# Patient Record
Sex: Female | Born: 1940 | Race: White | Hispanic: No | Smoking: Former smoker
Health system: Southern US, Community
[De-identification: ages and names within clinical notes are randomized; demographics above are authoritative.]

## PROBLEM LIST (undated history)

## (undated) DIAGNOSIS — I10 Essential (primary) hypertension: Secondary | ICD-10-CM

## (undated) DIAGNOSIS — M1990 Unspecified osteoarthritis, unspecified site: Secondary | ICD-10-CM

## (undated) DIAGNOSIS — I2510 Atherosclerotic heart disease of native coronary artery without angina pectoris: Secondary | ICD-10-CM

## (undated) DIAGNOSIS — I219 Acute myocardial infarction, unspecified: Secondary | ICD-10-CM

## (undated) HISTORY — PX: CORONARY STENT PLACEMENT: SHX1402

---

## 1998-01-23 ENCOUNTER — Ambulatory Visit (HOSPITAL_BASED_OUTPATIENT_CLINIC_OR_DEPARTMENT_OTHER): Admission: RE | Admit: 1998-01-23 | Discharge: 1998-01-23 | Payer: Self-pay | Admitting: Orthopedic Surgery

## 1998-09-18 ENCOUNTER — Ambulatory Visit (HOSPITAL_BASED_OUTPATIENT_CLINIC_OR_DEPARTMENT_OTHER): Admission: RE | Admit: 1998-09-18 | Discharge: 1998-09-18 | Payer: Self-pay | Admitting: Orthopedic Surgery

## 2000-02-21 ENCOUNTER — Other Ambulatory Visit: Admission: RE | Admit: 2000-02-21 | Discharge: 2000-02-21 | Payer: Self-pay | Admitting: Family Medicine

## 2005-03-17 ENCOUNTER — Emergency Department (HOSPITAL_COMMUNITY): Admission: EM | Admit: 2005-03-17 | Discharge: 2005-03-17 | Payer: Self-pay | Admitting: *Deleted

## 2006-02-20 ENCOUNTER — Emergency Department (HOSPITAL_COMMUNITY): Admission: EM | Admit: 2006-02-20 | Discharge: 2006-02-21 | Payer: Self-pay | Admitting: Emergency Medicine

## 2007-05-15 ENCOUNTER — Emergency Department (HOSPITAL_COMMUNITY): Admission: EM | Admit: 2007-05-15 | Discharge: 2007-05-15 | Payer: Self-pay | Admitting: Emergency Medicine

## 2010-05-30 ENCOUNTER — Ambulatory Visit (HOSPITAL_COMMUNITY)
Admission: RE | Admit: 2010-05-30 | Discharge: 2010-05-30 | Payer: Self-pay | Source: Home / Self Care | Attending: Family Medicine | Admitting: Family Medicine

## 2010-05-30 ENCOUNTER — Encounter
Admission: RE | Admit: 2010-05-30 | Discharge: 2010-05-30 | Payer: Self-pay | Source: Home / Self Care | Attending: Family Medicine | Admitting: Family Medicine

## 2010-08-01 ENCOUNTER — Ambulatory Visit (HOSPITAL_COMMUNITY): Payer: Medicare Other

## 2010-08-01 ENCOUNTER — Observation Stay (HOSPITAL_COMMUNITY)
Admission: RE | Admit: 2010-08-01 | Discharge: 2010-08-02 | Disposition: A | Discharge status: Home / Self Care | Payer: Medicare Other | Source: Ambulatory Visit | Attending: Ophthalmology | Admitting: Ophthalmology

## 2010-08-01 ENCOUNTER — Other Ambulatory Visit: Payer: Self-pay | Admitting: Ophthalmology

## 2010-08-01 DIAGNOSIS — J45909 Unspecified asthma, uncomplicated: Secondary | ICD-10-CM | POA: Insufficient documentation

## 2010-08-01 DIAGNOSIS — I10 Essential (primary) hypertension: Secondary | ICD-10-CM | POA: Insufficient documentation

## 2010-08-01 DIAGNOSIS — H4310 Vitreous hemorrhage, unspecified eye: Secondary | ICD-10-CM | POA: Insufficient documentation

## 2010-08-01 DIAGNOSIS — Y849 Medical procedure, unspecified as the cause of abnormal reaction of the patient, or of later complication, without mention of misadventure at the time of the procedure: Secondary | ICD-10-CM | POA: Insufficient documentation

## 2010-08-01 DIAGNOSIS — T8529XA Other mechanical complication of intraocular lens, initial encounter: Principal | ICD-10-CM | POA: Insufficient documentation

## 2010-08-01 LAB — CBC
HCT: 42.1 % (ref 36.0–46.0)
Hemoglobin: 14.7 g/dL (ref 12.0–15.0)
MCH: 32.3 pg (ref 26.0–34.0)
MCHC: 34.9 g/dL (ref 30.0–36.0)
Platelets: 219 10*3/uL (ref 150–400)
RBC: 4.55 MIL/uL (ref 3.87–5.11)
RDW: 12.9 % (ref 11.5–15.5)
WBC: 8.1 10*3/uL (ref 4.0–10.5)

## 2010-08-01 LAB — SURGICAL PCR SCREEN
MRSA, PCR: NEGATIVE
Staphylococcus aureus: NEGATIVE

## 2010-08-01 LAB — BASIC METABOLIC PANEL
Calcium: 9.8 mg/dL (ref 8.4–10.5)
Chloride: 103 mEq/L (ref 96–112)
Creatinine, Ser: 1.02 mg/dL (ref 0.4–1.2)
GFR calc Af Amer: >60 mL/min (ref >60)
Glucose, Bld: 94 mg/dL (ref 70–99)
Potassium: 4.8 mEq/L (ref 3.5–5.1)
Sodium: 142 mEq/L (ref 135–145)

## 2010-08-09 NOTE — Op Note (Signed)
Brittany Arnold, NOKES             ACCOUNT NO.:  [id number]  MEDICAL RECORD NO.:  [id number]           PATIENT TYPE:  O  LOCATION:  5148                         FACILITY:  [facility]  PHYSICIAN:  Beulah Gandy. Ashley Royalty, M.D. DATE OF BIRTH:  Nov 17, 1940  DATE OF PROCEDURE:  08/01/2010 DATE OF DISCHARGE:                              OPERATIVE REPORT   ADMISSION DIAGNOSIS:  Dislocated intraocular lens, vitreous hemorrhage, retained lens material, left eye.  PROCEDURES:  Pars plana vitrectomy, retinal photocoagulation, removal of intraocular lens from vitreous, placement of secondary intraocular lens, gas-fluid exchange, left eye.  SURGEON:  Beulah Gandy. Ashley Royalty, MD  ASSISTANT:  Rosalie Doctor, MA  ANESTHESIA:  General.  DETAILS:  Usual prep and drape, conjunctival peritomy from 8 o'clock around to 4 o'clock.  Half-thickness scleral flaps were raised at 3 and 9 o'clock in anticipation of IOL suture.  A three-layered corneoscleral wound was started back in the white space from 10 o'clock to 2 o'clock. A 25-gauge trocar was placed at 10, 2, and 4 o'clock.  Pars plana vitrectomy was begun just behind the pupillary axis.  Some capsular remnants were seen and in the vitreous was ensnaring the intraocular lens.  The vitreous carefully teased from the lens with the vitrectomy cutter and the lighted pick.  The lens was eventually freed and passed from the posterior chamber into the anterior chamber and out through the corneoscleral wound.  The vitrectomy was continued out into the far peripheral vitreous area down to the vitreous base where the vitreous base was trimmed.  All lens material was removed and vitreous hemorrhage was removed.  The indirect ophthalmoscope laser was moved into place, 680 burns were placed around the retinal periphery with a power of 280 mW, 1000 microns each, and 0.1 seconds each.  These burns were placed around weak areas in the retina and areas suspicious for break.   Two Prolene sutures were passed from 9 o'clock to 3 o'clock behind the iris and anterior to the capsular remnants.  The sutures were externalized through the corneal wound.  A new intraocular lens was brought onto the field made by [employer] model CZ70BD, power 16.5D, length 12.5 mm, optic 7.0 mm, serial number [id number], expiration date December 2013.  This lens was brought onto the field, inspected and cleaned.  The eyelets of the lens were attached to the Prolene sutures. The lens was then passed into the posterior chamber and dialed into place in the sulcus.  The Prolene sutures were knotted externally beneath the scleral flaps and scleral flaps covered the knots.  The cornea was closed with five interrupted 10-0 nylon sutures.  Additional vitrectomy was performed at this time removing pigment granules from the vitreous and performing a 30% gas-fluid exchange.  The instruments were removed and the trocars were removed from the eye.  The wounds were tested and found to be tight.  The conjunctiva was closed with 7-0chromic suture.  Polymyxin and gentamicin were irrigated into [place] space.  Closing pressure was 10 with a Risk manager. Decadron 10 mg was injected into the lower subconjunctival space. Marcaine was injected around the globe for  postop pain.  Polysporin ophthalmic ointment and a patch and shield were placed.  The patient was awakened and taken to recovery in satisfactory condition.  Complications none.  Duration 1 hour and 30 minutes.     Beulah Gandy. Ashley Royalty, M.D.     JDM/MEDQ  D:  08/01/2010  T:  08/02/2010  Job:  [id number]  Electronically Signed by Alan Mulder M.D. on 08/09/2010 06:13:50 AM

## 2011-01-24 ENCOUNTER — Ambulatory Visit
Admission: RE | Admit: 2011-01-24 | Discharge: 2011-01-24 | Disposition: A | Discharge status: Home / Self Care | Payer: Medicare Other | Source: Ambulatory Visit | Attending: Family Medicine | Admitting: Family Medicine

## 2011-01-24 ENCOUNTER — Other Ambulatory Visit: Payer: Self-pay | Admitting: Family Medicine

## 2011-01-24 DIAGNOSIS — R52 Pain, unspecified: Secondary | ICD-10-CM

## 2011-01-30 LAB — COMPREHENSIVE METABOLIC PANEL
ALT: 10
AST: 16
Albumin: 3.7
Alkaline Phosphatase: 58
BUN: 20
CO2: 28
Calcium: 9
Chloride: 96
Creatinine, Ser: 1.18
GFR calc Af Amer: 56 — ABNORMAL LOW
GFR calc non Af Amer: 46 — ABNORMAL LOW
Glucose, Bld: 113 — ABNORMAL HIGH
Potassium: 2.7 — CL
Sodium: 135
Total Bilirubin: 0.6
Total Protein: 6.7

## 2011-01-30 LAB — DIFFERENTIAL
Basophils Absolute: 0.1
Basophils Relative: 1
Eosinophils Absolute: 0.2
Eosinophils Relative: 2
Lymphocytes Relative: 19
Lymphs Abs: 2
Monocytes Absolute: 0.6
Monocytes Relative: 6
Neutro Abs: 8 — ABNORMAL HIGH
Neutrophils Relative %: 73

## 2011-01-30 LAB — CBC
HCT: 36.8
Hemoglobin: 13
MCHC: 35.2
MCV: 91.3
Platelets: 297
RBC: 4.03
RDW: 13.2
WBC: 10.9 — ABNORMAL HIGH

## 2011-01-30 LAB — POCT CARDIAC MARKERS
CKMB, poc: <1 — ABNORMAL LOW
Myoglobin, poc: 82.6
Operator id: 3206
Troponin i, poc: <0.05

## 2011-03-12 ENCOUNTER — Inpatient Hospital Stay (HOSPITAL_COMMUNITY)
Admission: EM | Admit: 2011-03-12 | Discharge: 2011-03-16 | DRG: 247 | Disposition: A | Discharge status: Home / Self Care | Payer: Medicare Other | Source: Other Acute Inpatient Hospital | Attending: Interventional Cardiology | Admitting: Interventional Cardiology

## 2011-03-12 DIAGNOSIS — I2510 Atherosclerotic heart disease of native coronary artery without angina pectoris: Secondary | ICD-10-CM | POA: Diagnosis not present

## 2011-03-12 DIAGNOSIS — I2109 ST elevation (STEMI) myocardial infarction involving other coronary artery of anterior wall: Secondary | ICD-10-CM | POA: Diagnosis present

## 2011-03-12 DIAGNOSIS — J449 Chronic obstructive pulmonary disease, unspecified: Secondary | ICD-10-CM | POA: Diagnosis present

## 2011-03-12 DIAGNOSIS — F17200 Nicotine dependence, unspecified, uncomplicated: Secondary | ICD-10-CM | POA: Diagnosis present

## 2011-03-12 DIAGNOSIS — I214 Non-ST elevation (NSTEMI) myocardial infarction: Secondary | ICD-10-CM

## 2011-03-12 DIAGNOSIS — I219 Acute myocardial infarction, unspecified: Secondary | ICD-10-CM

## 2011-03-12 DIAGNOSIS — E785 Hyperlipidemia, unspecified: Secondary | ICD-10-CM | POA: Diagnosis present

## 2011-03-12 DIAGNOSIS — I10 Essential (primary) hypertension: Secondary | ICD-10-CM | POA: Diagnosis present

## 2011-03-12 DIAGNOSIS — J4489 Other specified chronic obstructive pulmonary disease: Secondary | ICD-10-CM | POA: Diagnosis present

## 2011-03-12 DIAGNOSIS — R918 Other nonspecific abnormal finding of lung field: Secondary | ICD-10-CM | POA: Diagnosis not present

## 2011-03-12 LAB — CARDIAC PANEL(CRET KIN+CKTOT+MB+TROPI)
CK, MB: 2.5 ng/mL (ref 0.3–4.0)
CK, MB: 29.3 ng/mL (ref 0.3–4.0)
CK, MB: 40.2 ng/mL (ref 0.3–4.0)
Relative Index: INVALID (ref 0.0–2.5)
Relative Index: 10.7 — ABNORMAL HIGH (ref 0.0–2.5)
Relative Index: 9.5 — ABNORMAL HIGH (ref 0.0–2.5)
Total CK: 55 U/L (ref 7–177)
Total CK: 273 U/L — ABNORMAL HIGH (ref 7–177)
Total CK: 423 U/L — ABNORMAL HIGH (ref 7–177)
Troponin I: 0.32 ng/mL (ref <0.30)
Troponin I: 8.23 ng/mL (ref <0.30)
Troponin I: 6.1 ng/mL (ref <0.30)

## 2011-03-12 LAB — CBC
Hemoglobin: 13.9 g/dL (ref 12.0–15.0)
MCH: 32.7 pg (ref 26.0–34.0)
MCHC: 34.6 g/dL (ref 30.0–36.0)
Platelets: 215 10*3/uL (ref 150–400)
RBC: 4.25 MIL/uL (ref 3.87–5.11)
RDW: 13 % (ref 11.5–15.5)
WBC: 11.4 10*3/uL — ABNORMAL HIGH (ref 4.0–10.5)

## 2011-03-12 LAB — POCT I-STAT, CHEM 8
BUN: 18 mg/dL (ref 6–23)
Calcium, Ion: 1.16 mmol/L (ref 1.12–1.32)
Chloride: 103 mEq/L (ref 96–112)
Creatinine, Ser: 1.3 mg/dL — ABNORMAL HIGH (ref 0.50–1.10)
Glucose, Bld: 139 mg/dL — ABNORMAL HIGH (ref 70–99)
HCT: 44 % (ref 36.0–46.0)
Hemoglobin: 15 g/dL (ref 12.0–15.0)
Potassium: 2.7 mEq/L — CL (ref 3.5–5.1)
Sodium: 141 mEq/L (ref 135–145)
TCO2: 26 mmol/L (ref 0–100)

## 2011-03-12 LAB — PRO B NATRIURETIC PEPTIDE: Pro B Natriuretic peptide (BNP): 132.8 pg/mL — ABNORMAL HIGH (ref 0–125)

## 2011-03-12 LAB — BASIC METABOLIC PANEL
BUN: 17 mg/dL (ref 6–23)
Calcium: 9.9 mg/dL (ref 8.4–10.5)
Chloride: 99 mEq/L (ref 96–112)
Creatinine, Ser: 1.1 mg/dL (ref 0.50–1.10)
GFR calc non Af Amer: 50 mL/min — ABNORMAL LOW (ref >90)
Glucose, Bld: 140 mg/dL — ABNORMAL HIGH (ref 70–99)
Potassium: 2.6 mEq/L — CL (ref 3.5–5.1)

## 2011-03-12 LAB — DIFFERENTIAL
Basophils Absolute: 0 10*3/uL (ref 0.0–0.1)
Basophils Relative: 0 % (ref 0–1)
Eosinophils Absolute: 0.3 10*3/uL (ref 0.0–0.7)
Eosinophils Relative: 3 % (ref 0–5)
Lymphocytes Relative: 26 % (ref 12–46)
Lymphs Abs: 2.9 10*3/uL (ref 0.7–4.0)
Monocytes Absolute: 0.8 10*3/uL (ref 0.1–1.0)
Monocytes Relative: 7 % (ref 3–12)
Neutro Abs: 7.3 10*3/uL (ref 1.7–7.7)
Neutrophils Relative %: 64 % (ref 43–77)

## 2011-03-12 LAB — POCT I-STAT TROPONIN I: Troponin i, poc: 0.1 ng/mL (ref 0.00–0.08)

## 2011-03-12 LAB — POCT ACTIVATED CLOTTING TIME: Activated Clotting Time: 721 seconds

## 2011-03-12 LAB — MRSA PCR SCREENING: MRSA by PCR: NEGATIVE

## 2011-03-12 LAB — PROTIME-INR: Prothrombin Time: 14.6 seconds (ref 11.6–15.2)

## 2011-03-13 LAB — BASIC METABOLIC PANEL
BUN: 14 mg/dL (ref 6–23)
CO2: 27 mEq/L (ref 19–32)
Calcium: 9.7 mg/dL (ref 8.4–10.5)
Chloride: 100 mEq/L (ref 96–112)
Creatinine, Ser: 1.01 mg/dL (ref 0.50–1.10)
GFR calc Af Amer: 64 mL/min — ABNORMAL LOW (ref >90)
GFR calc non Af Amer: 55 mL/min — ABNORMAL LOW (ref >90)
Glucose, Bld: 107 mg/dL — ABNORMAL HIGH (ref 70–99)
Potassium: 3.7 mEq/L (ref 3.5–5.1)
Sodium: 140 mEq/L (ref 135–145)

## 2011-03-13 LAB — CBC
HCT: 42.8 % (ref 36.0–46.0)
Hemoglobin: 14.1 g/dL (ref 12.0–15.0)
MCH: 31.1 pg (ref 26.0–34.0)
MCHC: 32.9 g/dL (ref 30.0–36.0)
MCV: 94.3 fL (ref 78.0–100.0)
Platelets: 236 10*3/uL (ref 150–400)
RBC: 4.54 MIL/uL (ref 3.87–5.11)
RDW: 12.8 % (ref 11.5–15.5)
WBC: 11.6 10*3/uL — ABNORMAL HIGH (ref 4.0–10.5)

## 2011-03-13 NOTE — H&P (Signed)
NAMECHAU, SAVELL NO.:  [id number]  MEDICAL RECORD NO.:  [id number]  LOCATION:  2902                         FACILITY:  [facility]  PHYSICIAN:  Harlon Flor, MD   DATE OF BIRTH:  Jun 25, 1940  DATE OF ADMISSION:  03/12/2011 DATE OF DISCHARGE:                             HISTORY & PHYSICAL   ADMISSION DIAGNOSIS:  ST-elevation myocardial infarction.  CHIEF COMPLAINT:  Chest pain.  HISTORY OF PRESENT ILLNESS:  Ms. Brittany Arnold is a 70 year old, white female with past medical history significant for hypertension who presented to the emergency room tonight via ambulance with chest pain.  She was awoken by pressure-like chest pain and called 911.  The cath lab was activated by EMS en route due to anterior ST elevation.  En route, she went into ventricular tachycardia requiring cardioversion.  On arrival, she was somewhat somnolent but responsive to pain.  Her pain had resolved, and her STs had come down.  Her previous EKG prior to the VT showed hyperacute T-waves in anterior leads.  Due to the patient's somnolence, further history was difficult.  She denied any history of bleeding or upcoming surgeries.  She did recently have a surgery in March for a dislocated intra-ocular lens.  PAST MEDICAL HISTORY: 1. Hypertension. 2. COPD. 3. Dislocated intra-ocular lens and vitreous hemorrhage status post     surgery per Dr. Ashley Arnold in March 2012.  HOME MEDICATIONS:  Please refer to the med reconciliation.  The patient could only state she was on a blood pressure pill and a fluid pill as well as a muscle relaxer prior to her heart cath.  ALLERGIES:  PENICILLIN and SULFA.  SOCIAL HISTORY:  She lives at home, and she does smoke cigarettes and drinks alcohol only occasionally.  FAMILY HISTORY:  She was unaware of any family history of heart disease, although this history was somewhat limited.  REVIEW OF SYSTEMS:  Unable to obtain due to her somnolence at the time of  evaluation prior to her heart cath.  PHYSICAL EXAMINATION:  VITAL SIGNS:  Blood pressure 125/80, heart rate 75, respirations 16, temperature afebrile. GENERAL:  Somnolent but in no acute distress. HEENT:  Extraocular movements intact.  Oropharynx benign.  Nonicteric sclerae. NECK:  Supple. CARDIOVASCULAR:  Regular rate and rhythm without murmurs, rubs, or gallops. LUNGS:  Clear to auscultation bilaterally. ABDOMEN:  Soft, nontender, nondistended. EXTREMITIES:  Without edema.  Pulses were intact femoral and dorsalis pedis bilaterally. NEURO:  Grossly afocal.  She moves all extremities well.  Had equal strength in bilateral arms and legs, and answered questions appropriately although she was somewhat somnolent. SKIN:  No rashes. LYMPH:  No lymphadenopathy.  DIAGNOSTIC STUDIES:  EKG from EMS shows normal sinus rhythm with hyperacute T-waves in the anterior precordial leads.  Chest x-ray is pending.  White count of 11.4, hemoglobin 13.9, platelets 215, point of care troponin is elevated at 0.10.  Her BUN is 18 and creatinine is 1.3. Further labs are pending.  ASSESSMENT AND PLAN:  Brittany Arnold is a 70 year old white female with tobacco abuse and hypertension who presents to the ER with an anterior STEMI and went directly to the cath lab receiving a drug-eluting Resolute stent to her  mid LAD per Dr. Katrinka Arnold.  We will continue aspirin and Plavix, start her on metoprolol as tolerated, check her lipids and start statin along with routine post STEMI care.     Harlon Flor, MD     MMB/MEDQ  D:  03/12/2011  T:  03/12/2011  Job:  [id number]  Electronically Signed by Meridee Score MD on 03/13/2011 10:06:16 PM

## 2011-03-14 ENCOUNTER — Inpatient Hospital Stay (HOSPITAL_COMMUNITY): Payer: Medicare Other

## 2011-03-14 LAB — BASIC METABOLIC PANEL
BUN: 21 mg/dL (ref 6–23)
CO2: 28 mEq/L (ref 19–32)
Chloride: 98 mEq/L (ref 96–112)
Creatinine, Ser: 1.14 mg/dL — ABNORMAL HIGH (ref 0.50–1.10)
Glucose, Bld: 103 mg/dL — ABNORMAL HIGH (ref 70–99)

## 2011-03-14 LAB — CK TOTAL AND CKMB (NOT AT ARMC): Relative Index: 3.6 — ABNORMAL HIGH (ref 0.0–2.5)

## 2011-03-14 IMAGING — CR DG CHEST 2V
2 series · 2 of 2 positions shown · non-contrast
Comparison: [DATE]

CLINICAL DATA: Weakness.  Shortness of breath.  Heart stent placed.
Ex-smoker.

CHEST - 2 VIEW

[w chest pa]
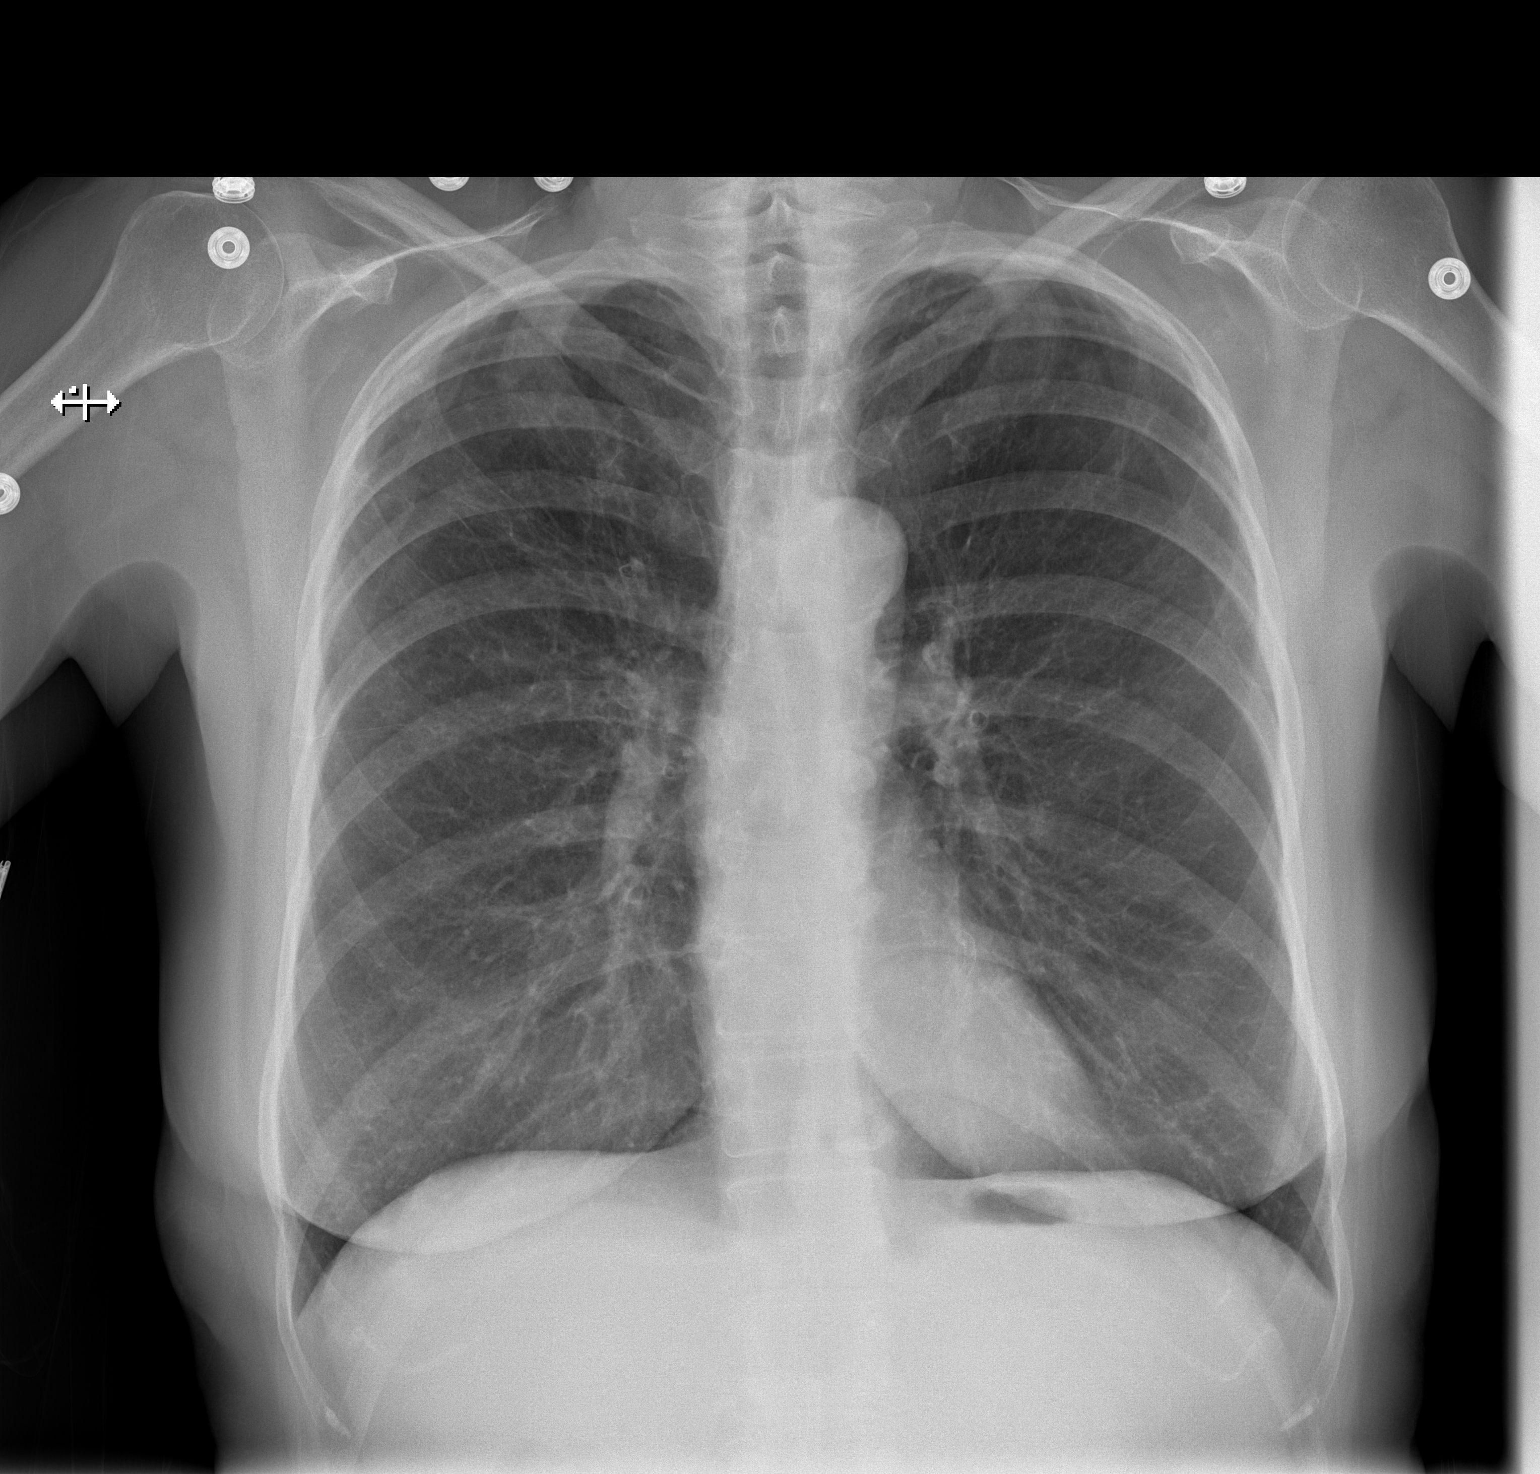

[w chest lat]
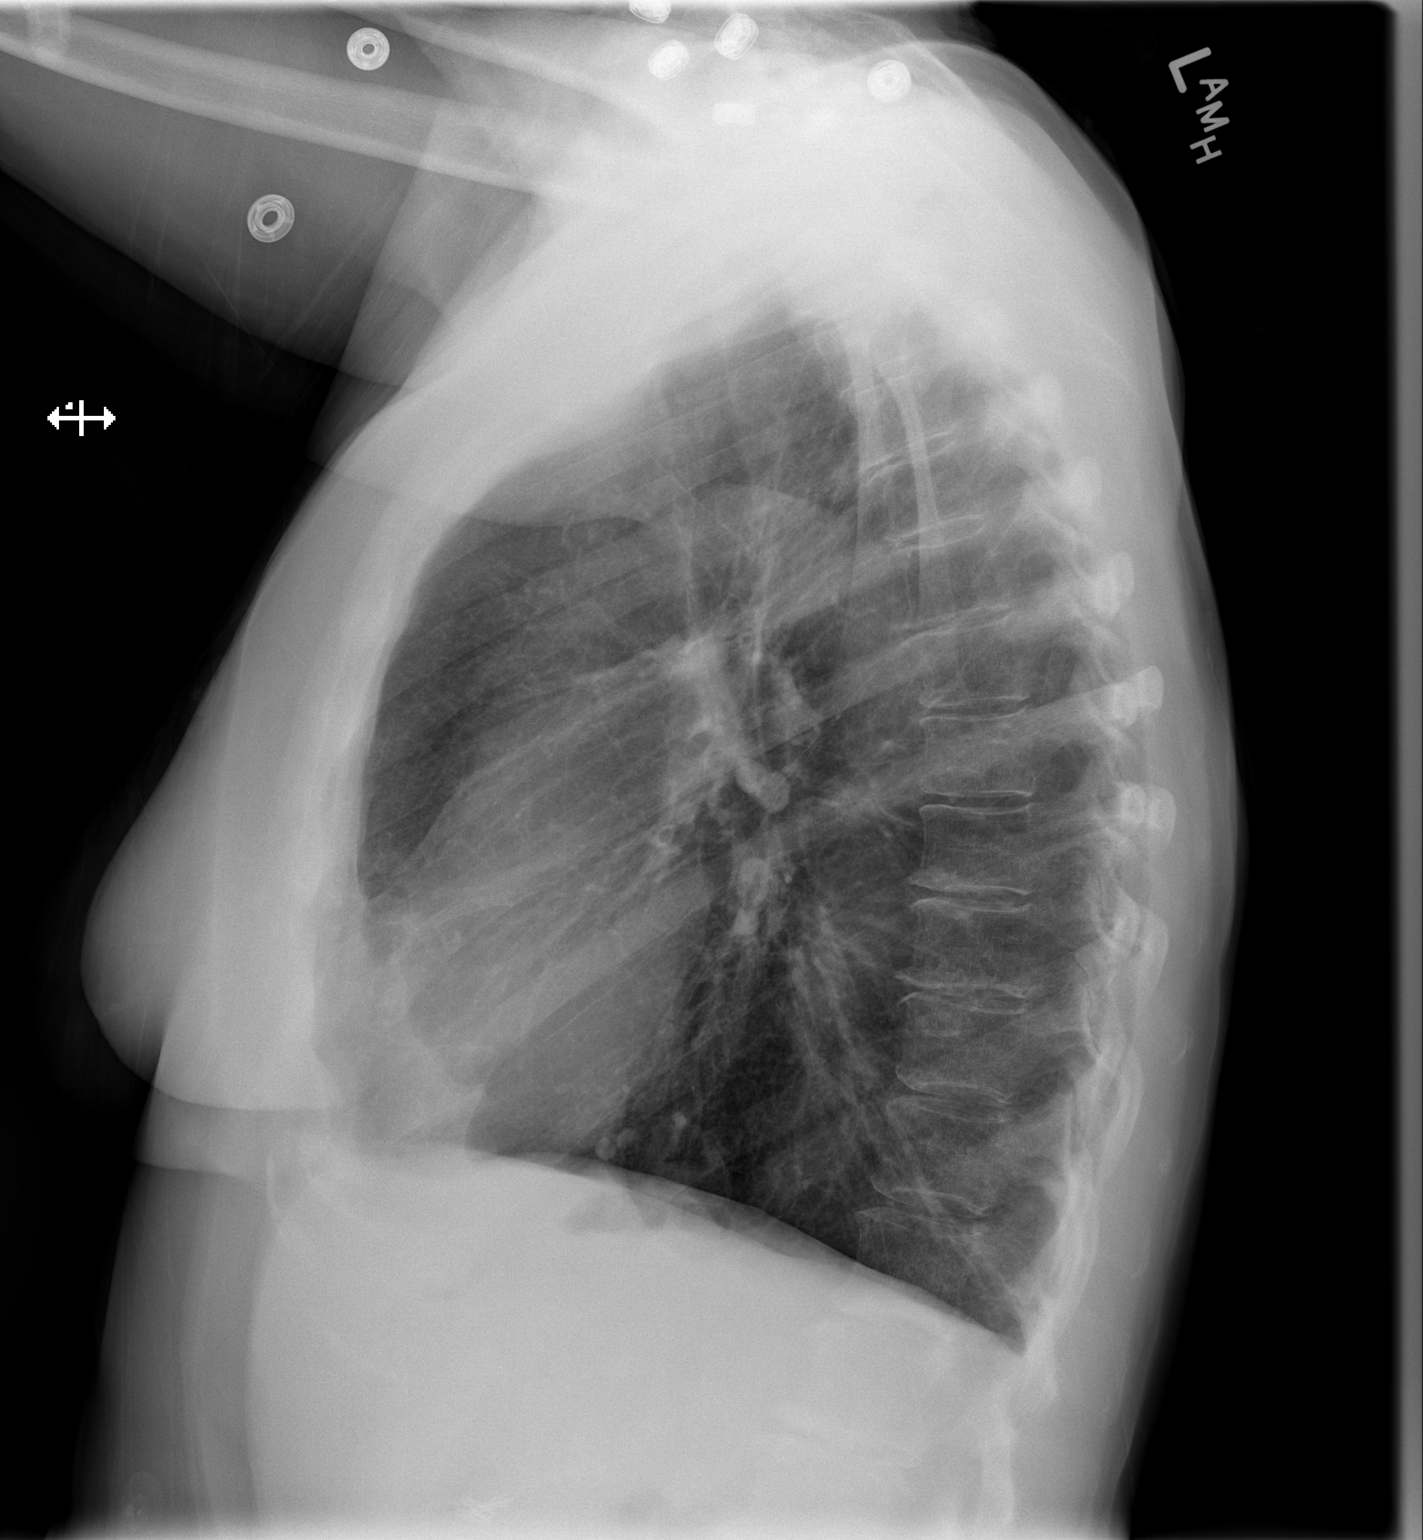

[2 of 2 positions shown; findings below may reference images not displayed]

FINDINGS: Mild hyperinflation. Midline trachea.  Normal heart size.
Calcified lymph nodes identified within the mediastinum and likely
the right lung base. No pleural effusion or pneumothorax.  Mild
COPD. No lobar consolidation.  The questioned nodule within the
right upper lobe is not readily identified.  There is subtle
nodularity within both upper lobes.
IMPRESSION: 1.  COPD, without acute cardiopulmonary disease.
2.  Subtle nodularity within the upper lobes.  Favored to represent
scarring.  Given the smoking history, non emergent chest CT should
be considered.
3.  Old granulomas disease.

## 2011-03-14 NOTE — Cardiovascular Report (Signed)
Brittany Arnold, CLIFT NO.:  [id number]  MEDICAL RECORD NO.:  [id number]  LOCATION:  2902                         FACILITY:  [facility]  PHYSICIAN:  Lyn Records, M.D.   DATE OF BIRTH:  March 13, 1941  DATE OF PROCEDURE:  03/12/2011 DATE OF DISCHARGE:                           CARDIAC CATHETERIZATION   INDICATION FOR THE PROCEDURE:  ST-elevation anterior myocardial infarction.  Status post ventricular tachycardia on the field requiring electrical cardioversion.  PROCEDURE PERFORMED: 1. Left heart catheterization via right femoral approach. 2. Coronary angiography. 3. Left ventriculography. 4. Drug-eluting stent mid left anterior descending.  DESCRIPTION:  Under emergency circumstances, the patient was brought to the catheterization laboratory from the emergency room where she was sterilely prepped and draped.  The procedure was prolonged by the fact that the patient was very "touchy" in the femoral area, which made it difficult to gain access.  She would nearly jump off the table any time we would try to palpate the femoral artery.  We finally gave sedation and were able to get access.  A 6-French sheath was placed after Xylocaine local infiltration and a 5- [place] A2 multipurpose catheter was used for hemodynamic recordings, left ventriculography, and selective left and right coronary angiography.  We then used an XB LAD catheter to perform PCI on the LAD, which had spontaneously recanalized and demonstrated evidence of thrombus, TIMI grade 2 flow, and a 99% stenosis.  Angiomax and Brilinta loading doses were administered.  We then performed PCI on the LAD using initially a thrombus extraction catheter. We did one run.  We then also pre-dilated with a 2.5 x 15 mm long Trek balloon.  Two balloon inflations were performed up to 12 atmospheres. We then positioned and deployed a 3.0 x 22 mm long Resolute Integrity stent to 10 atmospheres.  We post dilated with a  3.0 x 15 mm long [place] Trek to 16 atmospheres from the proximal to distal.  TIMI grade 3 flow was noted.  A significant step-up and step-down was noted from proximal to distal stent.  TIMI grade 3 flow was maintained.  The patient was asymptomatic at the completion of the case.  The access entry site was at the bifurcation of the femoral artery and therefore prevented Angio- Seal.  No complications occurred.  RESULTS: 1. Hemodynamic data:     a.     Left ventricular pressure 97/9 mmHg.     b.     Aortic pressure 98/65 mmHg. 2. Left ventriculography:  The left ventricle is normal in size.  Left     ventricular ejection fraction is 60%. 3. Coronary angiography.     a.     Left main coronary:  Widely patent.     b.     Left anterior descending coronary:  The LAD just distal to a      large diagonal contains 99% thrombus containing obstruction      followed by tandem intermediate stenoses of 50-70%.  The LAD is      large and wraps around the left ventricular apex.  The first      diagonal contains midvessel 40-50% stenosis.     c.     Circumflex  artery:  Circumflex coronary artery after the      left atrial recurrent branch contains an eccentric 85-90% stenosis      before the obtuse marginal bifurcates on the lateral wall.     d.     Right coronary artery:  Right coronary artery is large and      dominant.  No significant obstruction is noted.  Irregularities      are noted in the midvessel with up to 30-40% narrowing in the mid      vessel.  Distally, left ventricular branches are noted, and a      large PDA is seen.  No collaterals to the LAD are noted. 4. PCI:  As noted above, the PCI resulted in reduction of a 99%     thrombus containing stenosis with TIMI grade 3 flow to 0% with TIMI     grade 3 flow after stenting with a Resolute drug-eluting stent.  ASSESSMENT: 1. Acute anterior ST-elevation myocardial infarction accompanied by     ventricular tachycardia on the field requiring  electrical     conversion.  This likely represented the reperfusion arrhythmia. 2. Successful stenting of the mid left anterior descending from 99%     with TIMI grade 2 flow to 0% with TIMI grade 3 flow. 3. 80-90% stenosis in the mid circumflex. 4. Large widely patent right coronary artery. 5. Normal left ventricular function with mild apical hypokinesis.  PLAN: 1. Aspirin and Brilinta. 2. Continue beta-blocker therapy. 3. Statin therapy. 4. Smoking cessation. 5. Consider PCI on the circumflex at a later stage date.     Lyn Records, M.D.     HWS/MEDQ  D:  03/12/2011  T:  03/12/2011  Job:  [id number]  cc:   Windle Guard, M.D.  Electronically Signed by Verdis Prime M.D. on 03/14/2011 10:13:27 AM

## 2011-03-15 ENCOUNTER — Inpatient Hospital Stay (HOSPITAL_COMMUNITY): Payer: Medicare Other

## 2011-03-15 LAB — BASIC METABOLIC PANEL
BUN: 22 mg/dL (ref 6–23)
BUN: 19 mg/dL (ref 6–23)
CO2: 28 mEq/L (ref 19–32)
Calcium: 9.4 mg/dL (ref 8.4–10.5)
Chloride: 101 mEq/L (ref 96–112)
Chloride: 103 mEq/L (ref 96–112)
Creatinine, Ser: 1.05 mg/dL (ref 0.50–1.10)
Creatinine, Ser: 1.12 mg/dL — ABNORMAL HIGH (ref 0.50–1.10)
GFR calc Af Amer: 61 mL/min — ABNORMAL LOW (ref >90)
GFR calc Af Amer: 57 mL/min — ABNORMAL LOW (ref >90)
GFR calc non Af Amer: 53 mL/min — ABNORMAL LOW (ref >90)
GFR calc non Af Amer: 49 mL/min — ABNORMAL LOW (ref >90)
Glucose, Bld: 103 mg/dL — ABNORMAL HIGH (ref 70–99)
Glucose, Bld: 93 mg/dL (ref 70–99)
Potassium: 2.9 mEq/L — ABNORMAL LOW (ref 3.5–5.1)
Potassium: 3.9 mEq/L (ref 3.5–5.1)
Sodium: 141 mEq/L (ref 135–145)

## 2011-03-15 LAB — LIPID PANEL
Cholesterol: 191 mg/dL (ref 0–200)
Total CHOL/HDL Ratio: 4.4 RATIO
VLDL: 22 mg/dL (ref 0–40)

## 2011-03-15 MED ORDER — LEVALBUTEROL TARTRATE 45 MCG/ACT IN AERO: 2.0000 | INHALATION_SPRAY | Freq: Four times a day (QID) | RESPIRATORY_TRACT | Status: DC | PRN

## 2011-03-15 MED ORDER — CYCLOBENZAPRINE HCL 10 MG PO TABS
10.0000 mg | ORAL_TABLET | Freq: Three times a day (TID) | ORAL | Status: DC
  Administered 2011-03-15 – 2011-03-16 (×2): 10 mg via ORAL
  Filled 2011-03-15: qty 1

## 2011-03-15 MED ORDER — GUAIFENESIN-DM 100-10 MG/5ML PO SYRP: 15.0000 mL | ORAL_SOLUTION | ORAL | Status: DC | PRN

## 2011-03-15 MED ORDER — SIMVASTATIN 40 MG PO TABS
40.0000 mg | ORAL_TABLET | Freq: Every day | ORAL | Status: DC
  Filled 2011-03-15 (×2): qty 1

## 2011-03-15 MED ORDER — CLOPIDOGREL BISULFATE 75 MG PO TABS
75.0000 mg | ORAL_TABLET | Freq: Every day | ORAL | Status: DC
  Administered 2011-03-16: 75 mg via ORAL
  Filled 2011-03-15 (×3): qty 1

## 2011-03-15 MED ORDER — LEVALBUTEROL HCL 0.63 MG/3ML IN NEBU
0.6300 mg | INHALATION_SOLUTION | Freq: Four times a day (QID) | RESPIRATORY_TRACT | Status: DC | PRN
  Filled 2011-03-15: qty 3

## 2011-03-15 MED ORDER — ALBUTEROL SULFATE HFA 108 (90 BASE) MCG/ACT IN AERS
2.0000 | INHALATION_SPRAY | Freq: Three times a day (TID) | RESPIRATORY_TRACT | Status: DC
  Administered 2011-03-16: 2 via RESPIRATORY_TRACT

## 2011-03-15 MED ORDER — SODIUM CHLORIDE 0.9 % IJ SOLN: 3.0000 mL | Freq: Two times a day (BID) | INTRAMUSCULAR | Status: DC

## 2011-03-15 MED ORDER — ACETAMINOPHEN 325 MG PO TABS: 650.0000 mg | ORAL_TABLET | ORAL | Status: DC | PRN

## 2011-03-15 MED ORDER — ALPRAZOLAM 0.25 MG PO TABS: 0.2500 mg | ORAL_TABLET | Freq: Two times a day (BID) | ORAL | Status: DC | PRN

## 2011-03-15 MED ORDER — MORPHINE SULFATE 2 MG/ML IJ SOLN: 1.0000 mg | INTRAMUSCULAR | Status: DC | PRN

## 2011-03-15 MED ORDER — TRAMADOL HCL 50 MG PO TABS: 50.0000 mg | ORAL_TABLET | Freq: Three times a day (TID) | ORAL | Status: DC | PRN

## 2011-03-15 MED ORDER — METOPROLOL TARTRATE 50 MG PO TABS
50.0000 mg | ORAL_TABLET | Freq: Two times a day (BID) | ORAL | Status: DC
  Administered 2011-03-16: 50 mg via ORAL
  Filled 2011-03-15 (×4): qty 1

## 2011-03-15 MED ORDER — ASPIRIN EC 81 MG PO TBEC
81.0000 mg | DELAYED_RELEASE_TABLET | Freq: Every day | ORAL | Status: DC
  Administered 2011-03-16: 81 mg via ORAL
  Filled 2011-03-15 (×2): qty 1

## 2011-03-15 MED ORDER — ONDANSETRON HCL 4 MG/2ML IJ SOLN: 4.0000 mg | Freq: Four times a day (QID) | INTRAMUSCULAR | Status: DC | PRN

## 2011-03-15 MED ORDER — IOHEXOL 300 MG/ML  SOLN
80.0000 mL | Freq: Once | INTRAMUSCULAR | Status: AC | PRN
  Administered 2011-03-15: 80 mL via INTRAVENOUS

## 2011-03-16 DIAGNOSIS — I10 Essential (primary) hypertension: Secondary | ICD-10-CM | POA: Insufficient documentation

## 2011-03-16 DIAGNOSIS — I2109 ST elevation (STEMI) myocardial infarction involving other coronary artery of anterior wall: Secondary | ICD-10-CM | POA: Diagnosis present

## 2011-03-16 DIAGNOSIS — J449 Chronic obstructive pulmonary disease, unspecified: Secondary | ICD-10-CM | POA: Insufficient documentation

## 2011-03-16 DIAGNOSIS — I2510 Atherosclerotic heart disease of native coronary artery without angina pectoris: Secondary | ICD-10-CM | POA: Diagnosis not present

## 2011-03-16 DIAGNOSIS — R918 Other nonspecific abnormal finding of lung field: Secondary | ICD-10-CM | POA: Diagnosis not present

## 2011-03-16 LAB — BASIC METABOLIC PANEL
CO2: 24 mEq/L (ref 19–32)
Calcium: 9.7 mg/dL (ref 8.4–10.5)
Creatinine, Ser: 1.02 mg/dL (ref 0.50–1.10)
GFR calc non Af Amer: 55 mL/min — ABNORMAL LOW (ref >90)
Glucose, Bld: 89 mg/dL (ref 70–99)
Sodium: 140 mEq/L (ref 135–145)

## 2011-03-16 MED ORDER — ASPIRIN 81 MG PO TBEC: 81.0000 mg | DELAYED_RELEASE_TABLET | Freq: Every day | ORAL | Status: DC

## 2011-03-16 MED ORDER — ALBUTEROL SULFATE HFA 108 (90 BASE) MCG/ACT IN AERS: 2.0000 | INHALATION_SPRAY | Freq: Three times a day (TID) | RESPIRATORY_TRACT | Status: DC

## 2011-03-16 MED ORDER — SIMVASTATIN 40 MG PO TABS: 40.0000 mg | ORAL_TABLET | Freq: Every day | ORAL | Status: DC

## 2011-03-16 MED ORDER — CLOPIDOGREL BISULFATE 75 MG PO TABS: 75.0000 mg | ORAL_TABLET | Freq: Every day | ORAL | Status: DC

## 2011-03-16 MED ORDER — METOPROLOL TARTRATE 100 MG PO TABS: 50.0000 mg | ORAL_TABLET | Freq: Two times a day (BID) | ORAL | Status: DC

## 2011-03-16 NOTE — Discharge Summary (Signed)
  Addendum to D/C summary dictated 03/14/2011  The patient had problems with diarrhea on 11/3 and discharge was held.  A C diff was obtained but is pending at time of d/c.  Her diarrhea completely resolved within 48 hours and she is feeling fine. A chest CT was also obtained for evaluation of abnormal Chest Xray and showed increased nodularity and scarring and it was recommended that she have a followup  Chest CT in 12 months.   She was discharged home in stable condition with no changes in discharge medications.

## 2011-03-16 NOTE — Progress Notes (Signed)
Subjective: The is feeling great today.  Her diarrhea has resolved. She denies any chest pain, SOB, DOE, LE edema, diarrhea.  She wants to go home  Objective:  Vital Signs in the last 24 hours: Temp:  [98.4 F (36.9 C)] 98.4 F (36.9 C) (11/03 2200) Pulse Rate:  [97] 97  (11/03 2200) Resp:  [18] 18  (11/03 2200) BP: (105)/(71) 105/71 mmHg (11/03 2200) SpO2:  [94 %] 94 % (11/03 2200)  Intake/Output from previous day: 11/03 0701 - 11/04 0700 In: 960 [P.O.:960] Out: 1 [Stool:1] Intake/Output from this shift: Total I/O In: 960 [P.O.:960] Out: 1 [Stool:1]  Physical Exam: Lungs: clear to auscultation bilaterally Heart: regular rate and rhythm, S1, S2 normal, no murmur, click, rub or gallop Abdomen: soft, non-tender; bowel sounds normal; no masses,  no organomegaly Extremities: extremities normal, atraumatic, no cyanosis or edema  Lab Results: No results found for this basename: WBC:2,HGB:2,PLT:2 in the last 72 hours  Basename 03/15/11 1547 03/15/11 0545  NA 141 140  K 3.9 2.9*  CL 103 101  CO2 28 27  GLUCOSE 93 103*  BUN 19 22  CREATININE 1.12* 1.05     Basename 03/15/11 0545  CHOL 191   No results found for this basename: PROTIME in the last 72 hours  Imaging: Imaging results have been reviewed  Cardiac Studies:  Assessment:   1.  S/P anteroapical MI with DES to LAD on ASA/Plavix - D/C home today with followup in 2 weeks the Dr. Katrinka Blazing 2.  COPD 3. Hypotension - resolved after d/c of Lasix 4  lung lesion on chest xray with CT scan showing Pleural parenchymal scarring with nodularity in the posterior right upper lobe, including a 4 mm subpleural nodule. A single follow-up  CT chest is suggested in 12 months per Fleischner Society guidelines.  Moderate centrilobular emphysematous changes.  Calcified mediastinal/bilateral hilar lymph nodes, likely sequela  of prior granulomatous disease. ng lesion on chest xray with CT scan showing 5.  Diarrhea resolved 6.   Hypokalemia resolved   Plan:  1.  D/C home 2.  ASA/Plavix/Beta Blocker/Statin 3.  F/u with Dr. Katrinka Blazing on 03/24/2011 4.  Outpatient Chest CT with contrast in 1 year for followup of lund nodularity  LOS: 4 days    TURNER,TRACI R 03/16/2011, 6:46 AM

## 2011-07-12 ENCOUNTER — Other Ambulatory Visit: Payer: Self-pay

## 2011-07-12 ENCOUNTER — Encounter (HOSPITAL_COMMUNITY): Payer: Self-pay | Admitting: Emergency Medicine

## 2011-07-12 ENCOUNTER — Emergency Department (HOSPITAL_COMMUNITY)
Admission: EM | Admit: 2011-07-12 | Discharge: 2011-07-12 | Disposition: A | Discharge status: Home / Self Care | Payer: Medicare Other | Attending: Emergency Medicine | Admitting: Emergency Medicine

## 2011-07-12 ENCOUNTER — Emergency Department (HOSPITAL_COMMUNITY): Payer: Medicare Other

## 2011-07-12 DIAGNOSIS — S8010XA Contusion of unspecified lower leg, initial encounter: Secondary | ICD-10-CM | POA: Insufficient documentation

## 2011-07-12 DIAGNOSIS — Z7982 Long term (current) use of aspirin: Secondary | ICD-10-CM | POA: Insufficient documentation

## 2011-07-12 DIAGNOSIS — I219 Acute myocardial infarction, unspecified: Secondary | ICD-10-CM | POA: Insufficient documentation

## 2011-07-12 DIAGNOSIS — X58XXXA Exposure to other specified factors, initial encounter: Secondary | ICD-10-CM | POA: Insufficient documentation

## 2011-07-12 DIAGNOSIS — R0989 Other specified symptoms and signs involving the circulatory and respiratory systems: Secondary | ICD-10-CM | POA: Insufficient documentation

## 2011-07-12 DIAGNOSIS — M129 Arthropathy, unspecified: Secondary | ICD-10-CM | POA: Insufficient documentation

## 2011-07-12 DIAGNOSIS — I2510 Atherosclerotic heart disease of native coronary artery without angina pectoris: Secondary | ICD-10-CM | POA: Insufficient documentation

## 2011-07-12 DIAGNOSIS — Z79899 Other long term (current) drug therapy: Secondary | ICD-10-CM | POA: Insufficient documentation

## 2011-07-12 DIAGNOSIS — I10 Essential (primary) hypertension: Secondary | ICD-10-CM | POA: Insufficient documentation

## 2011-07-12 DIAGNOSIS — S8011XA Contusion of right lower leg, initial encounter: Secondary | ICD-10-CM

## 2011-07-12 DIAGNOSIS — M25519 Pain in unspecified shoulder: Secondary | ICD-10-CM | POA: Insufficient documentation

## 2011-07-12 DIAGNOSIS — M7989 Other specified soft tissue disorders: Secondary | ICD-10-CM | POA: Insufficient documentation

## 2011-07-12 DIAGNOSIS — M79609 Pain in unspecified limb: Secondary | ICD-10-CM | POA: Insufficient documentation

## 2011-07-12 DIAGNOSIS — I252 Old myocardial infarction: Secondary | ICD-10-CM | POA: Insufficient documentation

## 2011-07-12 HISTORY — DX: Essential (primary) hypertension: I10

## 2011-07-12 HISTORY — DX: Atherosclerotic heart disease of native coronary artery without angina pectoris: I25.10

## 2011-07-12 HISTORY — DX: Unspecified osteoarthritis, unspecified site: M19.90

## 2011-07-12 HISTORY — DX: Acute myocardial infarction, unspecified: I21.9

## 2011-07-12 LAB — BASIC METABOLIC PANEL WITH GFR
BUN: 24 mg/dL — ABNORMAL HIGH (ref 6–23)
CO2: 27 meq/L (ref 19–32)
Calcium: 10.1 mg/dL (ref 8.4–10.5)
Chloride: 103 meq/L (ref 96–112)
Creatinine, Ser: 1.11 mg/dL — ABNORMAL HIGH (ref 0.50–1.10)
GFR calc Af Amer: 57 mL/min — ABNORMAL LOW (ref >90)
GFR calc non Af Amer: 49 mL/min — ABNORMAL LOW (ref >90)
Glucose, Bld: 102 mg/dL — ABNORMAL HIGH (ref 70–99)
Potassium: 4 meq/L (ref 3.5–5.1)
Sodium: 141 meq/L (ref 135–145)

## 2011-07-12 LAB — POCT I-STAT TROPONIN I: Troponin i, poc: 0 ng/mL (ref 0.00–0.08)

## 2011-07-12 LAB — CBC
HCT: 42.8 % (ref 36.0–46.0)
Hemoglobin: 14.6 g/dL (ref 12.0–15.0)
MCH: 31.2 pg (ref 26.0–34.0)
MCHC: 34.1 g/dL (ref 30.0–36.0)
MCV: 91.5 fL (ref 78.0–100.0)
Platelets: 201 K/uL (ref 150–400)
RBC: 4.68 MIL/uL (ref 3.87–5.11)
RDW: 13.9 % (ref 11.5–15.5)
WBC: 9.1 K/uL (ref 4.0–10.5)

## 2011-07-12 NOTE — ED Provider Notes (Signed)
History     CSN: [id number]  Arrival date & time 07/12/11  1191   First MD Initiated Contact with Patient 07/12/11 2032      Chief Complaint  Patient presents with  . Leg lump    (Consider location/radiation/quality/duration/timing/severity/associated sxs/prior treatment) HPI History provided by the patient and the patient's daughter.  71 year old female with history of CAD on aspirin and Plavix presented with complaint of right calf lump.  Patient reports that she acutely noted the right calf lump this afternoon which has associated pain without significant warmth or redness. Patient does not recall trauma to this area but noted a bruise. No associated numbness tingling or weakness focally; however, patient has noted bilateral lower extremity weakness over the past one to 2 months which she believes feels "like she is getting poor circulation." No significant change recently.  Patient also noted right shoulder pain which occurred occasionally today in which he states is similar to prior a shoulder pain over the last 2-3 years. She states that this occurs with various activities, happens daily, and is without associated shortness of breath, chest pain, back pain, numbness, or tingling. Noted in nursing note, again, patient denies chest pain.  Patient has not been seen recently for her current complaints. Patient denies similar right leg not in the past and her son encouraged her to be evaluated for this, as they are both concerned for possible "phlebitis or blood clot."   Past Medical History  Diagnosis Date  . MI (myocardial infarction)   . Hypertension   . Coronary artery disease   . Arthritis     Past Surgical History  Procedure Date  . Coronary stent placement     History reviewed. No pertinent family history.  History  Substance Use Topics  . Smoking status: Former Games developer  . Smokeless tobacco: Not on file  . Alcohol Use: No    OB History    Grav Para Term Preterm  Abortions TAB SAB Ect Mult Living                  Review of Systems  Constitutional: Negative for fever and chills.  HENT: Negative for congestion, sore throat and rhinorrhea.   Eyes: Negative for pain and visual disturbance.  Respiratory: Negative for cough, shortness of breath and wheezing.   Cardiovascular: Negative for chest pain, palpitations and leg swelling (although reports a "lump" on her calf).  Gastrointestinal: Negative for nausea, vomiting, abdominal pain, diarrhea and blood in stool.  Genitourinary: Negative for dysuria and hematuria.  Musculoskeletal: Positive for arthralgias (especially Rt shoulder). Negative for back pain and gait problem.  Skin: Negative for rash and wound.  Neurological: Negative for dizziness, numbness and headaches.  Psychiatric/Behavioral: Negative for confusion and agitation.  All other systems reviewed and are negative.    Allergies  Penicillins and Sulfa antibiotics  Home Medications   Current Outpatient Rx  Name Route Sig Dispense Refill  . ALBUTEROL SULFATE HFA 108 (90 BASE) MCG/ACT IN AERS Inhalation Inhale 2 puffs into the lungs 3 (three) times daily.    . ASPIRIN 325 MG PO TBEC Oral Take 325 mg by mouth daily.    Marland Kitchen CLOPIDOGREL BISULFATE 75 MG PO TABS Oral Take 75 mg by mouth daily with breakfast.    . FUROSEMIDE 20 MG PO TABS Oral Take 20 mg by mouth daily.    Marland Kitchen METOPROLOL TARTRATE 100 MG PO TABS Oral Take 50 mg by mouth 2 (two) times daily.    Marland Kitchen NITROGLYCERIN  0.4 MG SL SUBL Sublingual Place 0.4 mg under the tongue every 5 (five) minutes as needed. For chest pain    . POTASSIUM CHLORIDE CRYS ER 20 MEQ PO TBCR Oral Take 20 mEq by mouth daily.    . TRAMADOL HCL 50 MG PO TABS Oral Take 100 mg by mouth daily. Maximum dose= 8 tablets per day     . VITAMIN D (ERGOCALCIFEROL) 50000 UNITS PO CAPS Oral Take 50,000 Units by mouth every 14 (fourteen) days. On Friday      BP 131/69  Pulse 71  Temp(Src) 97.8 F (36.6 C) (Oral)  Resp 24   SpO2 96%  Physical Exam  Nursing note and vitals reviewed. Constitutional: She is oriented to person, place, and time. No distress.       Awake, alert, oriented, NL speech  HENT:  Head: Normocephalic and atraumatic.  Right Ear: External ear normal.  Left Ear: External ear normal.  Nose: Nose normal.  Mouth/Throat: Oropharynx is clear and moist.  Eyes: Conjunctivae and EOM are normal. Pupils are equal, round, and reactive to light.  Neck: Normal range of motion. Neck supple.  Cardiovascular: Normal rate, regular rhythm and intact distal pulses.   No murmur heard. Pulmonary/Chest: Effort normal. No respiratory distress. She has no wheezes. She has rales (occasionally in the bases).  Abdominal: Soft. Bowel sounds are normal. There is no tenderness.  Musculoskeletal:       Right calf:  Healing ecchymosis on the posteriomedial calf with ~1cm superficial lump palpable when brushing over pt's skin; no sig edema or calf asymmetry  1-2+ DP pulses, no sig calf TTP, erythema, warmth  Neurological: She is alert and oriented to person, place, and time.  Skin: Skin is warm and dry. No rash noted. She is not diaphoretic.  Psychiatric: She has a normal mood and affect. Judgment normal.    ED Course  Procedures (including critical care time)   Date: 07/12/2011  Rate: 72  Rhythm: normal sinus rhythm  QRS Axis: normal  Intervals: normal  ST/T Wave abnormalities: normal   Labs Reviewed  BASIC METABOLIC PANEL - Abnormal; Notable for the following:    Glucose, Bld 102 (*)    BUN 24 (*)    Creatinine, Ser 1.11 (*)    GFR calc non Af Amer 49 (*)    GFR calc Af Amer 57 (*)    All other components within normal limits  CBC  POCT I-STAT TROPONIN I  D-DIMER, QUANTITATIVE   Dg Chest 2 View  07/12/2011  *RADIOLOGY REPORT*  Clinical Data: Chest pain  CHEST - 2 VIEW  Comparison: 03/15/2011  Findings: Heart size appears normal.  There is no pleural effusion or edema.  No airspace consolidation  identified.  The visualized osseous structures are unremarkable.  IMPRESSION:  1.  No active cardiopulmonary abnormalities.  Original Report Authenticated By: Rosealee Albee, M.D.     1. Contusion of right leg       MDM  71 year old female presenting with complaints of right calf lump noted recently. Patient also reporting right shoulder pain without chest pain (unlike noted in nursing note), stating this occurring with certain positions and movement and starting years ago without recent change.  Exam as above, right medial/posterior calf with healing ecchymosis and superficially palpable lump. However, no significant calf asymmetry, or tenderness, edema, or erythema to suggest DVT. EKG within normal limits. Patient not reporting symptoms consistent with a ACS; labs ordered in triage prior to ED bedding and with normal  troponin, WBC, lites, and essentially baseline creatinine.  Do not believe additional troponin are necessary. Clinical picture unlikely for DVT. Suspect small hematoma or contusion.  D-dimer added and within normal limits.  Will discharge with PCP followup and strict return precautions.       Particia Lather, MD 07/13/11 1191  Particia Lather, MD 07/13/11 [phone]

## 2011-07-12 NOTE — ED Notes (Addendum)
Patient complaining of chest pain (points to her right shoulder area when asked to point at pain) and right leg pain since this afternoon.  Patient states that the leg pain started before the chest pain; complaining of a "knot" in her right calf that is painful to touch.  Cardiac history includes stent placement and an MI back in October.  Patient denies shortness of breath, nausea, vomiting, and diaphoresis; but patient reports diarrhea.  Patient states that her main complaint is her right leg pain, and that she is chest pain free as of 1935.

## 2011-07-12 NOTE — Discharge Instructions (Signed)
Return immediately for worsening symptoms or other concerns.

## 2011-07-16 NOTE — ED Provider Notes (Signed)
Evaluation and management procedures were performed by the resident physician under my supervision/collaboration.    Felisa Bonier, MD 07/16/11 2030

## 2012-07-14 ENCOUNTER — Other Ambulatory Visit: Payer: Self-pay | Admitting: Interventional Cardiology

## 2012-07-16 ENCOUNTER — Ambulatory Visit (HOSPITAL_COMMUNITY)
Admission: RE | Admit: 2012-07-16 | Discharge: 2012-07-17 | Disposition: A | Discharge status: Home / Self Care | Payer: Medicare Other | Source: Ambulatory Visit | Attending: Interventional Cardiology | Admitting: Interventional Cardiology

## 2012-07-16 ENCOUNTER — Encounter (HOSPITAL_COMMUNITY)
Admission: RE | Disposition: A | Discharge status: Home / Self Care | Payer: Self-pay | Source: Ambulatory Visit | Attending: Interventional Cardiology

## 2012-07-16 DIAGNOSIS — I10 Essential (primary) hypertension: Secondary | ICD-10-CM

## 2012-07-16 DIAGNOSIS — J4489 Other specified chronic obstructive pulmonary disease: Secondary | ICD-10-CM | POA: Insufficient documentation

## 2012-07-16 DIAGNOSIS — Z9861 Coronary angioplasty status: Secondary | ICD-10-CM | POA: Insufficient documentation

## 2012-07-16 DIAGNOSIS — I200 Unstable angina: Secondary | ICD-10-CM | POA: Diagnosis present

## 2012-07-16 DIAGNOSIS — I252 Old myocardial infarction: Secondary | ICD-10-CM | POA: Insufficient documentation

## 2012-07-16 DIAGNOSIS — R911 Solitary pulmonary nodule: Secondary | ICD-10-CM | POA: Insufficient documentation

## 2012-07-16 DIAGNOSIS — I2510 Atherosclerotic heart disease of native coronary artery without angina pectoris: Secondary | ICD-10-CM

## 2012-07-16 DIAGNOSIS — E785 Hyperlipidemia, unspecified: Secondary | ICD-10-CM | POA: Insufficient documentation

## 2012-07-16 DIAGNOSIS — J449 Chronic obstructive pulmonary disease, unspecified: Secondary | ICD-10-CM | POA: Insufficient documentation

## 2012-07-16 HISTORY — PX: LEFT HEART CATHETERIZATION WITH CORONARY ANGIOGRAM: SHX5451

## 2012-07-16 SURGERY — LEFT HEART CATHETERIZATION WITH CORONARY ANGIOGRAM
Anesthesia: Moderate Sedation | Laterality: Bilateral

## 2012-07-16 MED ORDER — BIVALIRUDIN 250 MG IV SOLR
INTRAVENOUS | Status: AC
  Filled 2012-07-16: qty 250

## 2012-07-16 MED ORDER — ONDANSETRON HCL 4 MG/2ML IJ SOLN: 4.0000 mg | Freq: Four times a day (QID) | INTRAMUSCULAR | Status: DC | PRN

## 2012-07-16 MED ORDER — METOPROLOL TARTRATE 50 MG PO TABS
50.0000 mg | ORAL_TABLET | Freq: Two times a day (BID) | ORAL | Status: DC
  Administered 2012-07-16 – 2012-07-17 (×2): 50 mg via ORAL
  Filled 2012-07-16 (×4): qty 1

## 2012-07-16 MED ORDER — POTASSIUM CHLORIDE CRYS ER 20 MEQ PO TBCR
20.0000 meq | EXTENDED_RELEASE_TABLET | Freq: Every day | ORAL | Status: DC
  Administered 2012-07-16: 19:00:00 20 meq via ORAL
  Filled 2012-07-16 (×2): qty 1

## 2012-07-16 MED ORDER — CLOPIDOGREL BISULFATE 75 MG PO TABS
75.0000 mg | ORAL_TABLET | Freq: Every day | ORAL | Status: DC
  Administered 2012-07-17: 09:00:00 75 mg via ORAL
  Filled 2012-07-16: qty 1

## 2012-07-16 MED ORDER — SODIUM CHLORIDE 0.9 % IJ SOLN: 3.0000 mL | Freq: Two times a day (BID) | INTRAMUSCULAR | Status: DC

## 2012-07-16 MED ORDER — VITAMIN D (ERGOCALCIFEROL) 1.25 MG (50000 UNIT) PO CAPS
50000.0000 [IU] | ORAL_CAPSULE | ORAL | Status: DC
  Filled 2012-07-16: qty 1

## 2012-07-16 MED ORDER — HEPARIN SODIUM (PORCINE) 1000 UNIT/ML IJ SOLN
INTRAMUSCULAR | Status: AC
  Filled 2012-07-16: qty 1

## 2012-07-16 MED ORDER — FUROSEMIDE 20 MG PO TABS
20.0000 mg | ORAL_TABLET | Freq: Every day | ORAL | Status: DC
  Administered 2012-07-17: 04:00:00 20 mg via ORAL
  Filled 2012-07-16 (×4): qty 1

## 2012-07-16 MED ORDER — SODIUM CHLORIDE 0.9 % IV SOLN
INTRAVENOUS | Status: AC
  Administered 2012-07-16: 11:00:00 via INTRAVENOUS

## 2012-07-16 MED ORDER — ASPIRIN EC 325 MG PO TBEC: 325.0000 mg | DELAYED_RELEASE_TABLET | Freq: Every day | ORAL | Status: DC

## 2012-07-16 MED ORDER — CLOPIDOGREL BISULFATE 75 MG PO TABS
ORAL_TABLET | ORAL | Status: AC
  Filled 2012-07-16: qty 4

## 2012-07-16 MED ORDER — NITROGLYCERIN 0.4 MG SL SUBL: 0.4000 mg | SUBLINGUAL_TABLET | SUBLINGUAL | Status: DC | PRN

## 2012-07-16 MED ORDER — MIDAZOLAM HCL 2 MG/2ML IJ SOLN
INTRAMUSCULAR | Status: AC
  Filled 2012-07-16: qty 2

## 2012-07-16 MED ORDER — ACETAMINOPHEN 325 MG PO TABS: 650.0000 mg | ORAL_TABLET | ORAL | Status: DC | PRN

## 2012-07-16 MED ORDER — VERAPAMIL HCL 2.5 MG/ML IV SOLN
INTRAVENOUS | Status: AC
  Filled 2012-07-16: qty 2

## 2012-07-16 MED ORDER — FENTANYL CITRATE 0.05 MG/ML IJ SOLN
INTRAMUSCULAR | Status: AC
  Filled 2012-07-16: qty 2

## 2012-07-16 MED ORDER — SODIUM CHLORIDE 0.9 % IV SOLN
INTRAVENOUS | Status: DC
  Administered 2012-07-16: 07:00:00 via INTRAVENOUS

## 2012-07-16 MED ORDER — DIAZEPAM 5 MG PO TABS
5.0000 mg | ORAL_TABLET | ORAL | Status: AC
  Administered 2012-07-16: 5 mg via ORAL
  Filled 2012-07-16: qty 1

## 2012-07-16 MED ORDER — SODIUM CHLORIDE 0.9 % IV SOLN: 250.0000 mL | INTRAVENOUS | Status: DC | PRN

## 2012-07-16 MED ORDER — ATORVASTATIN CALCIUM 80 MG PO TABS
80.0000 mg | ORAL_TABLET | Freq: Every day | ORAL | Status: DC
  Administered 2012-07-16: 80 mg via ORAL
  Filled 2012-07-16 (×2): qty 1

## 2012-07-16 MED ORDER — SODIUM CHLORIDE 0.9 % IJ SOLN: 3.0000 mL | INTRAMUSCULAR | Status: DC | PRN

## 2012-07-16 MED ORDER — NITROGLYCERIN 1 MG/10 ML FOR IR/CATH LAB
INTRA_ARTERIAL | Status: AC
  Filled 2012-07-16: qty 10

## 2012-07-16 MED ORDER — TRAMADOL HCL 50 MG PO TABS
100.0000 mg | ORAL_TABLET | Freq: Every day | ORAL | Status: DC
  Administered 2012-07-17: 100 mg via ORAL
  Filled 2012-07-16 (×2): qty 2

## 2012-07-16 MED ORDER — ATORVASTATIN CALCIUM 20 MG PO TABS: 20.0000 mg | ORAL_TABLET | Freq: Every day | ORAL | Status: DC

## 2012-07-16 MED ORDER — CLOPIDOGREL BISULFATE 75 MG PO TABS: 75.0000 mg | ORAL_TABLET | Freq: Every day | ORAL | Status: DC

## 2012-07-16 MED ORDER — HEPARIN (PORCINE) IN NACL 2-0.9 UNIT/ML-% IJ SOLN
INTRAMUSCULAR | Status: AC
  Filled 2012-07-16: qty 1000

## 2012-07-16 MED ORDER — ASPIRIN EC 325 MG PO TBEC
325.0000 mg | DELAYED_RELEASE_TABLET | Freq: Every day | ORAL | Status: DC
  Administered 2012-07-17: 09:00:00 325 mg via ORAL
  Filled 2012-07-16: qty 1

## 2012-07-16 MED ORDER — LIDOCAINE HCL (PF) 1 % IJ SOLN
INTRAMUSCULAR | Status: AC
  Filled 2012-07-16: qty 30

## 2012-07-16 MED ORDER — ASPIRIN 81 MG PO CHEW
324.0000 mg | CHEWABLE_TABLET | ORAL | Status: AC
  Administered 2012-07-16: 324 mg via ORAL
  Filled 2012-07-16: qty 4

## 2012-07-16 MED ORDER — ALBUTEROL SULFATE HFA 108 (90 BASE) MCG/ACT IN AERS
2.0000 | INHALATION_SPRAY | Freq: Three times a day (TID) | RESPIRATORY_TRACT | Status: DC
  Administered 2012-07-16 – 2012-07-17 (×3): 2 via RESPIRATORY_TRACT
  Filled 2012-07-16: qty 6.7

## 2012-07-16 NOTE — H&P (Signed)
  Brittany Arnold had anterior myocardial infarction in 2012. Residual disease in the circumflex and distal LAD have not been treated. She has been asymptomatic until the past 2 months. Over this time she has had spontaneous episodes of chest pressure relieved by nitroglycerin. She is undergoing coronary angiography with the diagnosis of accelerating angina. We aren't attempting to define the presence absence of stent restenosis and/or progression of disease in other territories. Please see the scanned H&P from our office.

## 2012-07-16 NOTE — CV Procedure (Signed)
PERCUTANEOUS CORONARY INTERVENTION   Brittany Arnold is a 72 y.o. female  INDICATION: ACS with angina occurring at rest in a patient with prior history of LAD stenting during STEMI.   PROCEDURE: Left heart catheterization with coronary angiography, left ventriculography, and LAD and circumflex and DES implantation   CONSENT: The risks, benefits, and details of the procedure were explained to the patient. Risks including death, MI, stroke, bleeding, limb ischemia, renal failure and allergy were described and accepted by the patient.  Informed written consent was obtained prior to proceeding.  PROCEDURE TECHNIQUE:  After Xylocaine anesthesia a 5 French sheath was placed in the right radial artery with a single anterior needle wall stick.   Diagnostic angiography was performed using a 5 [place] A2 multipurpose cath. Left ventriculography was done with the same catheter. After studying the digital images there was severe progression distal to the stents in the LAD and progression of disease in the mid circumflex to critical values at could conceivably cause pain at rest as the patient has experienced.  We chose to proceed with PCI using a 5 French EBU 3.5 cm guide catheter. Coronary guiding shots were made. Antithrombotic therapy, bivalirudin bolus and infusion, was begun and determined to be therapeutic by ACT. Antiplatelet therapy, Plavix 300 mg, was loaded. The patient had been started on Plavix 72 hours prior to the procedure.  A Prowater wire was then used to cross the stenosis in the distal LAD. Predilatation with a Trek 2.25 x 12 mm long balloon was performed. We then positioned and deployed a 2.25 x 20 mm long PROMUS PREMIER drug-eluting stent to 15 atmospheres. Postdilatation was then performed using a 2.75 x 16 mm long [place] Trek to 16 atmospheres. Brisk antegrade flow was noted.  We then turned our attention to the circumflex artery. The Prowater wire was retracted from the LAD and  advanced across the stenosis in the circumflex. Predilatation was performed using the 2.25 x 12 mm Trek to atmospheres. A 16 x 2.5 mm PROMUS PREMIER drug-eluting stent was positioned and deployed to 13 atmospheres. Postdilatation was then performed using a 2.75 x 15 mm long [place] Trek to 14  atmospheres. There was a notable step up in the distal stent margin. I was somewhat concerned about the possibility of distal dissection but decided against placing any further stents since there was good flow and no contrast staining.  CONTRAST:  Total of 215 cc.  COMPLICATIONS:  None.    ANGIOGRAPHIC RESULTS:   LEFT VENTRICULOGRAPHY: Normal left ventricular function with EF of 60% . Left ventricular pressure was 103/19 mmHg.  CORONARY ANGIOGRAPHY: The left main coronary artery is widely patent  Left anterior descending coronary is diffusely diseased from proximal to distal. The mid vessel contains a stent is widely patent. There is 50% narrowing proximal to the stent. There is a region of tandem stenosis beyond the stent in the mid to distal vessel with the distal most lesion containing a 95% stenosis. The LAD wraps around the left ventricular apex.  The circumflex coronary artery contains a region of segmental mid stenosis with the most focally severe area up to 90%. The vessel beyond the stenosis is tortuous and moderate in size.  The right coronary is widely patent. The vessel is dominant.  PCI RESULTS:  The LAD was reduced from segmental 95% to 0% with TIMI grade 3 flow  The mid circumflex was reduced from 90% to 0% with TIMI grade 3 flow    IMPRESSIONS:  1. Crescendo angina due to progression of disease in the mid to distal LAD and mid circumflex.  2. Successful DES implantation in the mid to distal LAD with an overlapping of the previously placed mid stent.  3. Successful DES implantation in the mid circumflex.  4. Normal left ventricular function   RECOMMENDATION:  Discharge in a.m.  Aspirin and Plavix. P2 Y12 assay to evaluate Plavix effect.  Brittany Noe, MD 07/16/2012 11:02 AM

## 2012-07-16 NOTE — Progress Notes (Signed)
Utilization Review Completed Camellia J. Wood, RN, BSN, NCM [phone]  

## 2012-07-16 NOTE — Discharge Summary (Signed)
Patient ID: Brittany Arnold MRN: [id number] DOB/AGE: 05-15-1940 72 y.o.  Admit date: 07/16/2012 Discharge date: 07/16/2012  Primary Discharge Diagnosis: Crescendo angina, with multiple episodes of pain occurring at rest and responsive to nitroglycerin despite antianginal therapy including high-dose beta blocker therapy  Secondary Discharge Diagnosis: Hyperlipidemia, with patient noncompliant on statin therapy  COPD  Pulmonary nodule  Coronary artery disease with prior anterior myocardial infarction 2012 treated with DES    Significant Diagnostic Studies: Cardiac cath and DES implantation distal LAD and mid circumflex, HWB Smith M.D., 07/16/12 Consults: None  Hospital Course: The patient underwent catheterization and was demonstrated to have subtotal occlusion of the mid to distal LAD beyond the previously placed stent and significant progression of disease in the circumflex. Both areas were stented with drug-eluting Promus Premier stents with good results. The procedure was done from the right radial approach.  On the morning of discharge the patient was felt to be stable and multiple for discharge.   Discharge Exam: Blood pressure 99/67, pulse 60, temperature 97.5 F (36.4 C), temperature source Oral, resp. rate 18, height 5\' 3"  (1.6 m), weight 70.308 kg (155 lb), SpO2 94.00%.    Right radial cath site with evidence of small ecchymosis but no hematoma.  Chest clear  Cardiac exam without rub. Labs:   Lab Results  Component Value Date   WBC 9.1 07/12/2011   HGB 14.6 07/12/2011   HCT 42.8 07/12/2011   MCV 91.5 07/12/2011   PLT 201 07/12/2011   No results found for this basename: NA, K, CL, CO2, BUN, CREATININE, CALCIUM, LABALBU, PROT, BILITOT, ALKPHOS, ALT, AST, GLUCOSE,  in the last 168 hours Lab Results  Component Value Date   CKTOTAL 215* 03/14/2011   CKMB 7.7* 03/14/2011   TROPONINI 6.10* 03/12/2011    Lab Results  Component Value Date   CHOL 191 03/15/2011   Lab  Results  Component Value Date   HDL 43 03/15/2011   Lab Results  Component Value Date   LDLCALC 126* 03/15/2011   Lab Results  Component Value Date   TRIG 111 03/15/2011   Lab Results  Component Value Date   CHOLHDL 4.4 03/15/2011   No results found for this basename: LDLDIRECT      Radiology: no recent study EKG:  Normal post procedure EKG  FOLLOW UP PLANS AND APPOINTMENTS    Medication List    TAKE these medications       albuterol 108 (90 BASE) MCG/ACT inhaler  Commonly known as:  PROVENTIL HFA;VENTOLIN HFA  Inhale 2 puffs into the lungs 3 (three) times daily.     aspirin 325 MG EC tablet  Take 325 mg by mouth daily.     atorvastatin 20 MG tablet  Commonly known as:  LIPITOR  Take 1 tablet (20 mg total) by mouth daily at 6 PM.     clopidogrel 75 MG tablet  Commonly known as:  PLAVIX  Take 1 tablet (75 mg total) by mouth daily with breakfast.  Start taking on:  07/17/2012     furosemide 20 MG tablet  Commonly known as:  LASIX  Take 20 mg by mouth daily.     metoprolol 100 MG tablet  Commonly known as:  LOPRESSOR  Take 50 mg by mouth 2 (two) times daily.     nitroGLYCERIN 0.4 MG SL tablet  Commonly known as:  NITROSTAT  Place 0.4 mg under the tongue every 5 (five) minutes as needed. For chest pain  potassium chloride SA 20 MEQ tablet  Commonly known as:  K-DUR,KLOR-CON  Take 20 mEq by mouth daily.     traMADol 50 MG tablet  Commonly known as:  ULTRAM  Take 100 mg by mouth daily. Maximum dose= 8 tablets per day     Vitamin D (Ergocalciferol) 50000 UNITS Caps  Commonly known as:  DRISDOL  Take 50,000 Units by mouth every 14 (fourteen) days. On Friday           Follow-up Information   Follow up with [name], MD On 07/23/2012. (9:30 AM)    Contact information:   [address] [place] [address] [phone]       BRING ALL MEDICATIONS WITH YOU TO FOLLOW UP APPOINTMENTS  Time spent with patient to include  physician time: 20 min Signed: [name] 07/16/2012, 2:30 PM

## 2012-07-16 NOTE — Progress Notes (Signed)
TR BAND REMOVAL  LOCATION:  right radial  DEFLATED PER PROTOCOL:  yes  TIME BAND OFF / DRESSING APPLIED:   1530   SITE UPON ARRIVAL:   Level 1  SITE AFTER BAND REMOVAL:  Level 0  REVERSE ALLEN'S TEST:    positive  CIRCULATION SENSATION AND MOVEMENT:  Within Normal Limits  yes  COMMENTS:

## 2012-07-17 LAB — CBC
HCT: 41.9 % (ref 36.0–46.0)
MCH: 32.1 pg (ref 26.0–34.0)
MCV: 92.1 fL (ref 78.0–100.0)
RDW: 13 % (ref 11.5–15.5)
WBC: 6.7 10*3/uL (ref 4.0–10.5)

## 2012-07-17 LAB — BASIC METABOLIC PANEL
BUN: 18 mg/dL (ref 6–23)
CO2: 24 mEq/L (ref 19–32)
Calcium: 9.4 mg/dL (ref 8.4–10.5)
Chloride: 105 mEq/L (ref 96–112)
Creatinine, Ser: 1.08 mg/dL (ref 0.50–1.10)
GFR calc Af Amer: 59 mL/min — ABNORMAL LOW (ref >90)

## 2012-07-17 NOTE — Progress Notes (Signed)
Moderate sob with walking to bathroom, audible wheezing.  O2 sat 94% on ra.  Pt missed dose of lasix yesterday due to cath and received 1500 cc IVF.   AM doses of Albuterol MDI and po lasix given early.  Pt back to bed, dozing quietly, resp unlabored.

## 2012-07-17 NOTE — Progress Notes (Signed)
CARDIAC REHAB PHASE I   PRE:  Rate/Rhythm: Sr 84  BP:  Supine:  Sitting: 126/91  Standing:    SaO2: 92 RA  MODE:  Ambulation: 200 ft   POST:  Rate/Rhythm: 100  BP:  Supine:  Sitting: 166/70  Standing:    SaO2: 94 RA  Ambulated pt in hallway x 1 with no complaints of cp.  Pt with audible shortness of breath but denies feeling short of breath,  O2 sat 91-92 which improved to 94 with plb.  Pt back to room for education.  Reviewed heart healthy diet with nutrition, exercise guidelines, ntg protocol and alert 911 for any unrelieved cp.  Pt verbalized understanding, handouts provided  Pt declined cardiac rehab phase II.  Pt already active and exercising on her own. [phone] Arna Medici

## 2012-07-17 NOTE — Discharge Summary (Signed)
Patient ID:  Brittany Arnold  MRN: [id number]  DOB/AGE: Feb 16, 1941 72 y.o.  Admit date: 07/16/2012  Discharge date: 07/16/2012  Primary Discharge Diagnosis: Crescendo angina, with multiple episodes of pain occurring at rest and responsive to nitroglycerin despite antianginal therapy including high-dose beta blocker therapy   Secondary Discharge Diagnosis: Hyperlipidemia, with patient noncompliant on statin therapy  COPD  Pulmonary nodule  Coronary artery disease with prior anterior myocardial infarction 2012 treated with DES   Significant Diagnostic Studies: Cardiac cath and DES implantation distal LAD and mid circumflex, HWB Smith M.D., 07/16/12   Consults: None  Hospital Course: The patient underwent catheterization and was demonstrated to have subtotal occlusion of the mid to distal LAD beyond the previously placed stent and significant progression of disease in the circumflex. Both areas were stented with drug-eluting Promus Premier stents with good results. The procedure was done from the right radial approach.  On the morning of discharge the patient was felt to be stable and multiple for discharge. Ambulated well with cardiac rehab. PLT assay shows inhibition.   Discharge Exam:  Blood pressure 99/67, pulse 60, temperature 97.5 F (36.4 C), temperature source Oral, resp. rate 18, height 5\' 3"  (1.6 m), weight 70.308 kg (155 lb), SpO2 94.00%.  No complaints this am.  Right radial cath site with evidence of small ecchymosis but no hematoma.  Chest clear  Cardiac exam without rub.   Labs:  Lab Results   Component  Value  Date    WBC  9.1  07/12/2011    HGB  14.6  07/12/2011    HCT  42.8  07/12/2011    MCV  91.5  07/12/2011    PLT  201  07/12/2011    Component  Value  Date    LDLCALC  126*  03/15/2011    Creat 1.08  Radiology: no recent study  EKG: Normal post procedure EKG  FOLLOW UP PLANS AND APPOINTMENTS     Medication List     TAKE these medications       albuterol 108 (90 BASE)  MCG/ACT inhaler    Commonly known as: PROVENTIL HFA;VENTOLIN HFA    Inhale 2 puffs into the lungs 3 (three) times daily.    aspirin 325 MG EC tablet    Take 325 mg by mouth daily.    atorvastatin 20 MG tablet    Commonly known as: LIPITOR    Take 1 tablet (20 mg total) by mouth daily at 6 PM.    clopidogrel 75 MG tablet    Commonly known as: PLAVIX    Take 1 tablet (75 mg total) by mouth daily with breakfast.    Start taking on: 07/17/2012    furosemide 20 MG tablet    Commonly known as: LASIX    Take 20 mg by mouth daily.    metoprolol 100 MG tablet    Commonly known as: LOPRESSOR    Take 50 mg by mouth 2 (two) times daily.    nitroGLYCERIN 0.4 MG SL tablet    Commonly known as: NITROSTAT    Place 0.4 mg under the tongue every 5 (five) minutes as needed. For chest pain    potassium chloride SA 20 MEQ tablet    Commonly known as: K-DUR,KLOR-CON    Take 20 mEq by mouth daily.    traMADol 50 MG tablet    Commonly known as: ULTRAM    Take 100 mg by mouth daily. Maximum dose= 8 tablets per day    Vitamin  D (Ergocalciferol) 50000 UNITS Caps    Commonly known as: DRISDOL    Take 50,000 Units by mouth every 14 (fourteen) days. On Friday          Follow-up Information    Follow up with [name], MD On 07/23/2012. (9:30 AM)    Contact information:    [address]  [address]  [place] [address]  [phone]      BRING ALL MEDICATIONS WITH YOU TO FOLLOW UP APPOINTMENTS  Time spent with patient to include physician time: 20 min

## 2012-07-19 MED FILL — Dextrose Inj 5%: INTRAVENOUS | Qty: 50 | Status: AC

## 2013-08-19 ENCOUNTER — Other Ambulatory Visit: Payer: Self-pay | Admitting: Interventional Cardiology

## 2013-08-23 ENCOUNTER — Other Ambulatory Visit: Payer: Self-pay

## 2013-08-23 MED ORDER — ATORVASTATIN CALCIUM 20 MG PO TABS
ORAL_TABLET | ORAL | Status: DC
Start: 2013-08-23 — End: 2013-12-23

## 2013-12-22 ENCOUNTER — Encounter (HOSPITAL_COMMUNITY): Payer: Self-pay | Admitting: Emergency Medicine

## 2013-12-22 ENCOUNTER — Emergency Department (HOSPITAL_COMMUNITY): Payer: Medicare Other

## 2013-12-22 DIAGNOSIS — Z79899 Other long term (current) drug therapy: Secondary | ICD-10-CM | POA: Insufficient documentation

## 2013-12-22 DIAGNOSIS — Z87891 Personal history of nicotine dependence: Secondary | ICD-10-CM | POA: Insufficient documentation

## 2013-12-22 DIAGNOSIS — R61 Generalized hyperhidrosis: Secondary | ICD-10-CM | POA: Insufficient documentation

## 2013-12-22 DIAGNOSIS — I10 Essential (primary) hypertension: Secondary | ICD-10-CM | POA: Insufficient documentation

## 2013-12-22 DIAGNOSIS — R079 Chest pain, unspecified: Secondary | ICD-10-CM | POA: Insufficient documentation

## 2013-12-22 DIAGNOSIS — E870 Hyperosmolality and hypernatremia: Secondary | ICD-10-CM | POA: Insufficient documentation

## 2013-12-22 DIAGNOSIS — Z9861 Coronary angioplasty status: Secondary | ICD-10-CM | POA: Insufficient documentation

## 2013-12-22 DIAGNOSIS — R0789 Other chest pain: Secondary | ICD-10-CM | POA: Diagnosis not present

## 2013-12-22 DIAGNOSIS — R011 Cardiac murmur, unspecified: Secondary | ICD-10-CM | POA: Insufficient documentation

## 2013-12-22 DIAGNOSIS — M129 Arthropathy, unspecified: Secondary | ICD-10-CM | POA: Diagnosis not present

## 2013-12-22 DIAGNOSIS — Z880 Allergy status to penicillin: Secondary | ICD-10-CM | POA: Insufficient documentation

## 2013-12-22 DIAGNOSIS — Z7982 Long term (current) use of aspirin: Secondary | ICD-10-CM | POA: Diagnosis not present

## 2013-12-22 DIAGNOSIS — I2510 Atherosclerotic heart disease of native coronary artery without angina pectoris: Secondary | ICD-10-CM | POA: Insufficient documentation

## 2013-12-22 DIAGNOSIS — I252 Old myocardial infarction: Secondary | ICD-10-CM | POA: Diagnosis not present

## 2013-12-22 DIAGNOSIS — J441 Chronic obstructive pulmonary disease with (acute) exacerbation: Secondary | ICD-10-CM | POA: Insufficient documentation

## 2013-12-22 LAB — I-STAT TROPONIN, ED: Troponin i, poc: 0 ng/mL (ref 0.00–0.08)

## 2013-12-22 LAB — CBC WITH DIFFERENTIAL/PLATELET
Basophils Absolute: 0 10*3/uL (ref 0.0–0.1)
Basophils Relative: 0 % (ref 0–1)
EOS ABS: 0.3 10*3/uL (ref 0.0–0.7)
EOS PCT: 4 % (ref 0–5)
HCT: 42.5 % (ref 36.0–46.0)
HEMOGLOBIN: 14.4 g/dL (ref 12.0–15.0)
LYMPHS ABS: 2 10*3/uL (ref 0.7–4.0)
Lymphocytes Relative: 25 % (ref 12–46)
MCH: 31.7 pg (ref 26.0–34.0)
MCHC: 33.9 g/dL (ref 30.0–36.0)
MCV: 93.6 fL (ref 78.0–100.0)
MONOS PCT: 8 % (ref 3–12)
Monocytes Absolute: 0.6 10*3/uL (ref 0.1–1.0)
Neutro Abs: 5.1 10*3/uL (ref 1.7–7.7)
Neutrophils Relative %: 63 % (ref 43–77)
Platelets: 190 10*3/uL (ref 150–400)
RBC: 4.54 MIL/uL (ref 3.87–5.11)
RDW: 13.3 % (ref 11.5–15.5)
WBC: 8.1 10*3/uL (ref 4.0–10.5)

## 2013-12-22 LAB — COMPREHENSIVE METABOLIC PANEL
ALT: 14 U/L (ref 0–35)
ANION GAP: 14 (ref 5–15)
AST: 19 U/L (ref 0–37)
Albumin: 3.9 g/dL (ref 3.5–5.2)
Alkaline Phosphatase: 109 U/L (ref 39–117)
BUN: 22 mg/dL (ref 6–23)
CALCIUM: 9.3 mg/dL (ref 8.4–10.5)
CO2: 30 mEq/L (ref 19–32)
Chloride: 104 mEq/L (ref 96–112)
Creatinine, Ser: 1.13 mg/dL — ABNORMAL HIGH (ref 0.50–1.10)
GFR calc non Af Amer: 47 mL/min — ABNORMAL LOW (ref >90)
GFR, EST AFRICAN AMERICAN: 55 mL/min — AB (ref >90)
GLUCOSE: 106 mg/dL — AB (ref 70–99)
Potassium: 4.7 mEq/L (ref 3.7–5.3)
Sodium: 148 mEq/L — ABNORMAL HIGH (ref 137–147)
TOTAL PROTEIN: 7.2 g/dL (ref 6.0–8.3)
Total Bilirubin: 0.6 mg/dL (ref 0.3–1.2)

## 2013-12-22 NOTE — ED Notes (Signed)
Pt st's earlier tonight she became short of breath and felt a tightness in her chest.  St's she has had a MI in the past.  At this time she st's shortness of breath has improved and tightness has subsided.

## 2013-12-23 ENCOUNTER — Encounter (HOSPITAL_COMMUNITY): Payer: Medicare Other

## 2013-12-23 ENCOUNTER — Encounter (HOSPITAL_COMMUNITY): Payer: Self-pay | Admitting: Internal Medicine

## 2013-12-23 ENCOUNTER — Observation Stay (HOSPITAL_COMMUNITY): Payer: Medicare Other

## 2013-12-23 ENCOUNTER — Observation Stay (HOSPITAL_COMMUNITY)
Admission: EM | Admit: 2013-12-23 | Discharge: 2013-12-23 | Disposition: A | Discharge status: Home / Self Care | Payer: Medicare Other | Attending: Internal Medicine | Admitting: Internal Medicine

## 2013-12-23 DIAGNOSIS — I209 Angina pectoris, unspecified: Secondary | ICD-10-CM

## 2013-12-23 DIAGNOSIS — I2510 Atherosclerotic heart disease of native coronary artery without angina pectoris: Secondary | ICD-10-CM

## 2013-12-23 DIAGNOSIS — R0789 Other chest pain: Secondary | ICD-10-CM

## 2013-12-23 DIAGNOSIS — I2109 ST elevation (STEMI) myocardial infarction involving other coronary artery of anterior wall: Secondary | ICD-10-CM

## 2013-12-23 DIAGNOSIS — I2583 Coronary atherosclerosis due to lipid rich plaque: Secondary | ICD-10-CM

## 2013-12-23 DIAGNOSIS — J438 Other emphysema: Secondary | ICD-10-CM

## 2013-12-23 DIAGNOSIS — E870 Hyperosmolality and hypernatremia: Secondary | ICD-10-CM | POA: Diagnosis present

## 2013-12-23 DIAGNOSIS — I219 Acute myocardial infarction, unspecified: Secondary | ICD-10-CM | POA: Diagnosis present

## 2013-12-23 DIAGNOSIS — J449 Chronic obstructive pulmonary disease, unspecified: Secondary | ICD-10-CM | POA: Diagnosis present

## 2013-12-23 DIAGNOSIS — I10 Essential (primary) hypertension: Secondary | ICD-10-CM | POA: Diagnosis present

## 2013-12-23 DIAGNOSIS — I200 Unstable angina: Secondary | ICD-10-CM

## 2013-12-23 DIAGNOSIS — R918 Other nonspecific abnormal finding of lung field: Secondary | ICD-10-CM

## 2013-12-23 DIAGNOSIS — R079 Chest pain, unspecified: Secondary | ICD-10-CM | POA: Diagnosis present

## 2013-12-23 DIAGNOSIS — I25119 Atherosclerotic heart disease of native coronary artery with unspecified angina pectoris: Secondary | ICD-10-CM

## 2013-12-23 LAB — COMPREHENSIVE METABOLIC PANEL
ALK PHOS: 93 U/L (ref 39–117)
ALT: 13 U/L (ref 0–35)
AST: 18 U/L (ref 0–37)
Albumin: 3.6 g/dL (ref 3.5–5.2)
Anion gap: 10 (ref 5–15)
BUN: 20 mg/dL (ref 6–23)
CO2: 29 mEq/L (ref 19–32)
Calcium: 9.1 mg/dL (ref 8.4–10.5)
Chloride: 108 mEq/L (ref 96–112)
Creatinine, Ser: 1.1 mg/dL (ref 0.50–1.10)
GFR calc non Af Amer: 49 mL/min — ABNORMAL LOW (ref >90)
GFR, EST AFRICAN AMERICAN: 57 mL/min — AB (ref >90)
GLUCOSE: 107 mg/dL — AB (ref 70–99)
POTASSIUM: 4.2 meq/L (ref 3.7–5.3)
SODIUM: 147 meq/L (ref 137–147)
TOTAL PROTEIN: 6.6 g/dL (ref 6.0–8.3)
Total Bilirubin: 0.4 mg/dL (ref 0.3–1.2)

## 2013-12-23 LAB — CBC WITH DIFFERENTIAL/PLATELET
Basophils Absolute: 0 10*3/uL (ref 0.0–0.1)
Basophils Relative: 0 % (ref 0–1)
EOS ABS: 0.2 10*3/uL (ref 0.0–0.7)
Eosinophils Relative: 4 % (ref 0–5)
HEMATOCRIT: 41.7 % (ref 36.0–46.0)
Hemoglobin: 13.8 g/dL (ref 12.0–15.0)
Lymphocytes Relative: 27 % (ref 12–46)
Lymphs Abs: 1.7 10*3/uL (ref 0.7–4.0)
MCH: 31.5 pg (ref 26.0–34.0)
MCHC: 33.1 g/dL (ref 30.0–36.0)
MCV: 95.2 fL (ref 78.0–100.0)
MONOS PCT: 8 % (ref 3–12)
Monocytes Absolute: 0.5 10*3/uL (ref 0.1–1.0)
Neutro Abs: 3.7 10*3/uL (ref 1.7–7.7)
Neutrophils Relative %: 61 % (ref 43–77)
Platelets: 167 10*3/uL (ref 150–400)
RBC: 4.38 MIL/uL (ref 3.87–5.11)
RDW: 13.3 % (ref 11.5–15.5)
WBC: 6.1 10*3/uL (ref 4.0–10.5)

## 2013-12-23 LAB — TROPONIN I
Troponin I: <0.3 ng/mL (ref <0.30)
Troponin I: <0.3 ng/mL (ref <0.30)
Troponin I: <0.3 ng/mL (ref <0.30)

## 2013-12-23 LAB — D-DIMER, QUANTITATIVE: D-Dimer, Quant: <0.27 ug/mL-FEU (ref 0.00–0.48)

## 2013-12-23 MED ORDER — SODIUM CHLORIDE 0.9 % IJ SOLN
80.0000 mg | INTRAVENOUS | Status: AC
  Administered 2013-12-23: 40 mg via INTRAVENOUS
  Filled 2013-12-23: qty 3.2

## 2013-12-23 MED ORDER — ACETAMINOPHEN 325 MG PO TABS: 650.0000 mg | ORAL_TABLET | Freq: Four times a day (QID) | ORAL | Status: DC | PRN

## 2013-12-23 MED ORDER — METOPROLOL TARTRATE 1 MG/ML IV SOLN
INTRAVENOUS | Status: AC
  Administered 2013-12-23: 2.5 mg via INTRAVENOUS
  Filled 2013-12-23: qty 5

## 2013-12-23 MED ORDER — TRAMADOL HCL 50 MG PO TABS
100.0000 mg | ORAL_TABLET | Freq: Every day | ORAL | Status: DC
  Administered 2013-12-23: 100 mg via ORAL
  Filled 2013-12-23: qty 2

## 2013-12-23 MED ORDER — TECHNETIUM TC 99M SESTAMIBI - CARDIOLITE
30.0000 | Freq: Once | INTRAVENOUS | Status: AC | PRN
  Administered 2013-12-23: 30 via INTRAVENOUS

## 2013-12-23 MED ORDER — REGADENOSON 0.4 MG/5ML IV SOLN
INTRAVENOUS | Status: AC
Start: 2013-12-23 — End: 2013-12-23
  Administered 2013-12-23: 0.4 mg via INTRAVENOUS
  Filled 2013-12-23: qty 5

## 2013-12-23 MED ORDER — ONDANSETRON HCL 4 MG PO TABS
4.0000 mg | ORAL_TABLET | Freq: Four times a day (QID) | ORAL | Status: DC | PRN
Start: 2013-12-23 — End: 2013-12-23

## 2013-12-23 MED ORDER — METOPROLOL TARTRATE 50 MG PO TABS
50.0000 mg | ORAL_TABLET | Freq: Two times a day (BID) | ORAL | Status: DC
  Filled 2013-12-23 (×2): qty 1

## 2013-12-23 MED ORDER — ONDANSETRON HCL 4 MG/2ML IJ SOLN: 4.0000 mg | Freq: Four times a day (QID) | INTRAMUSCULAR | Status: DC | PRN

## 2013-12-23 MED ORDER — TIOTROPIUM BROMIDE MONOHYDRATE 18 MCG IN CAPS: 18.0000 ug | ORAL_CAPSULE | Freq: Every day | RESPIRATORY_TRACT | Status: AC

## 2013-12-23 MED ORDER — ENOXAPARIN SODIUM 40 MG/0.4ML ~~LOC~~ SOLN
40.0000 mg | SUBCUTANEOUS | Status: DC
  Administered 2013-12-23: 40 mg via SUBCUTANEOUS
  Filled 2013-12-23: qty 0.4

## 2013-12-23 MED ORDER — ATORVASTATIN CALCIUM 20 MG PO TABS
20.0000 mg | ORAL_TABLET | Freq: Every day | ORAL | Status: DC
  Administered 2013-12-23: 20 mg via ORAL
  Filled 2013-12-23: qty 1

## 2013-12-23 MED ORDER — SODIUM CHLORIDE 0.9 % IJ SOLN
3.0000 mL | Freq: Two times a day (BID) | INTRAMUSCULAR | Status: DC
  Administered 2013-12-23: 3 mL via INTRAVENOUS

## 2013-12-23 MED ORDER — ASPIRIN 81 MG PO CHEW
324.0000 mg | CHEWABLE_TABLET | Freq: Once | ORAL | Status: AC
  Administered 2013-12-23: 324 mg via ORAL
  Filled 2013-12-23: qty 4

## 2013-12-23 MED ORDER — METOPROLOL TARTRATE 1 MG/ML IV SOLN
2.5000 mg | INTRAVENOUS | Status: DC | PRN
  Administered 2013-12-23 (×2): 2.5 mg via INTRAVENOUS

## 2013-12-23 MED ORDER — ASPIRIN EC 325 MG PO TBEC
325.0000 mg | DELAYED_RELEASE_TABLET | Freq: Every day | ORAL | Status: DC
  Administered 2013-12-23: 325 mg via ORAL
  Filled 2013-12-23: qty 1

## 2013-12-23 MED ORDER — ACETAMINOPHEN 650 MG RE SUPP: 650.0000 mg | Freq: Four times a day (QID) | RECTAL | Status: DC | PRN

## 2013-12-23 MED ORDER — NITROGLYCERIN 0.4 MG SL SUBL: 0.4000 mg | SUBLINGUAL_TABLET | SUBLINGUAL | Status: DC | PRN

## 2013-12-23 MED ORDER — TECHNETIUM TC 99M SESTAMIBI - CARDIOLITE
10.0000 | Freq: Once | INTRAVENOUS | Status: AC | PRN
  Administered 2013-12-23: 10 via INTRAVENOUS

## 2013-12-23 MED ORDER — METOPROLOL TARTRATE 50 MG PO TABS
ORAL_TABLET | ORAL | Status: AC
  Administered 2013-12-23: 50 mg via ORAL
  Filled 2013-12-23: qty 1

## 2013-12-23 MED ORDER — SODIUM CHLORIDE 0.9 % IJ SOLN: 3.0000 mL | Freq: Two times a day (BID) | INTRAMUSCULAR | Status: DC

## 2013-12-23 MED ORDER — REGADENOSON 0.4 MG/5ML IV SOLN
0.4000 mg | Freq: Once | INTRAVENOUS | Status: AC
  Administered 2013-12-23: 0.4 mg via INTRAVENOUS
  Filled 2013-12-23: qty 5

## 2013-12-23 MED ORDER — ALBUTEROL SULFATE (2.5 MG/3ML) 0.083% IN NEBU: 3.0000 mL | INHALATION_SOLUTION | Freq: Four times a day (QID) | RESPIRATORY_TRACT | Status: DC | PRN

## 2013-12-23 NOTE — Progress Notes (Signed)
Pts assessment unchanged from this am. D/c'd to private vehicle via wheelchair with grandson

## 2013-12-23 NOTE — Progress Notes (Signed)
UR Completed Marthella Osorno Graves-Bigelow, RN,BSN [phone]  

## 2013-12-23 NOTE — Progress Notes (Addendum)
Patient ID: Brittany Arnold, female   DOB: 1941/01/11, 73 y.o.   MRN: [id number]  Patient had Lexiscan stress test, at 7 minutes patient felt tired and heart rate still 111. Aminophyline for reversal was ordered by R Barrett PA. Started giving IV Aminophylline at 1135. At 1136 developed SVT at rate of 170. R Barrett PA aware and Aminophylline was ordered to be stopped, patient received 40mg . Patient kept having SVT which would revert to NSR spontaneously or with valsalva manoeuver. Color remained pink, skin warm and dry, no SOB, no chest pain. BP stable. Metoprolol 2.5mg  IV given at 1144 and 1149. Metoprolol 1000 hour dose of 50 mg po given at 1200. No further SVT seen in last 10 minutes. On telemetry. Report called to patient nurse on 3W.

## 2013-12-23 NOTE — Progress Notes (Signed)
Late entry, at 1135 pt went into SVT 160s. Pt in nuclear medicine, I called to speak with the RN and they were aware of patients heart rate and Bjorn Loser was at bedside. Pt converted back to NSR. Will continue to monitor

## 2013-12-23 NOTE — Discharge Instructions (Signed)
Follow with Primary MD  in 7 days   Get CBC, CMP  by Primary MD next visit.   Activity: As tolerated with Full fall precautions use walker/cane & assistance as needed   Disposition Home     Diet: Heart Healthy   For Heart failure patients - Check your Weight same time everyday, if you gain over 2 pounds, or you develop in leg swelling, experience more shortness of breath or chest pain, call your Primary MD immediately. Follow Cardiac Low Salt Diet and 1.8 lit/day fluid restriction.   On your next visit with her primary care physician please Get Medicines reviewed and adjusted.  Please request your Prim.MD to go over all Hospital Tests and Procedure/Radiological results at the follow up, please get all Hospital records sent to your Prim MD by signing hospital release before you go home.   If you experience worsening of your admission symptoms, develop shortness of breath, life threatening emergency, suicidal or homicidal thoughts you must seek medical attention immediately by calling 911 or calling your MD immediately  if symptoms less severe.  You Must read complete instructions/literature along with all the possible adverse reactions/side effects for all the Medicines you take and that have been prescribed to you. Take any new Medicines after you have completely understood and accpet all the possible adverse reactions/side effects.   Do not drive, operating heavy machinery, perform activities at heights, swimming or participation in water activities or provide baby sitting services if your were admitted for syncope or siezures until you have seen by Primary MD or a Neurologist and advised to do so again.  Do not drive when taking Pain medications.    Do not take more than prescribed Pain, Sleep and Anxiety Medications  Special Instructions: If you have smoked or chewed Tobacco  in the last 2 yrs please stop smoking, stop any regular Alcohol  and or any Recreational drug use.  Wear  Seat belts while driving.   Please note  You were cared for by a hospitalist during your hospital stay. If you have any questions about your discharge medications or the care you received while you were in the hospital after you are discharged, you can call the unit and asked to speak with the hospitalist on call if the hospitalist that took care of you is not available. Once you are discharged, your primary care physician will handle any further medical issues. Please note that NO REFILLS for any discharge medications will be authorized once you are discharged, as it is imperative that you return to your primary care physician (or establish a relationship with a primary care physician if you do not have one) for your aftercare needs so that they can reassess your need for medications and monitor your lab values.

## 2013-12-23 NOTE — ED Provider Notes (Signed)
CSN: [id number]     Arrival date & time 12/22/13  1958 History   First MD Initiated Contact with Patient 12/23/13 0058     Chief Complaint  Patient presents with  . Chest Pain     (Consider location/radiation/quality/duration/timing/severity/associated sxs/prior Treatment) HPI Comments: Pt comes in with cc of chest pain, dib. Pt has hx of COPD, CAD, s/p stent placement in 2012 and again last year. Reports that she started having chest tightness and dib this afternoon, unprovoked, and with no specific aggravating or relieving factors. Pt also reports feeling weak and having sweats. She denies any nausea, and although her pain lasted for several minutes, she is now chest pain free. Pt has hx of COPD. She has hx of dib, exertional, but it is unchanged. No new cough, no wheezing.    Patient is a 73 y.o. female presenting with chest pain. The history is provided by the patient and medical records.  Chest Pain Associated symptoms: diaphoresis and shortness of breath   Associated symptoms: no abdominal pain, no cough, no headache, no nausea and not vomiting     Past Medical History  Diagnosis Date  . MI (myocardial infarction)   . Hypertension   . Coronary artery disease   . Arthritis    Past Surgical History  Procedure Laterality Date  . Coronary stent placement     No family history on file. History  Substance Use Topics  . Smoking status: Former Games developer  . Smokeless tobacco: Not on file  . Alcohol Use: No   OB History   Grav Para Term Preterm Abortions TAB SAB Ect Mult Living                 Review of Systems  Constitutional: Positive for diaphoresis and activity change.  Respiratory: Positive for shortness of breath. Negative for cough and wheezing.   Cardiovascular: Positive for chest pain.  Gastrointestinal: Negative for nausea, vomiting and abdominal pain.  Genitourinary: Negative for dysuria.  Musculoskeletal: Negative for neck pain.  Neurological: Negative for  headaches.      Allergies  Penicillins and Sulfa antibiotics  Home Medications   Prior to Admission medications   Medication Sig Start Date End Date Taking? Authorizing Provider  albuterol (PROVENTIL HFA;VENTOLIN HFA) 108 (90 BASE) MCG/ACT inhaler Inhale 1 puff into the lungs every 6 (six) hours as needed for wheezing or shortness of breath.   Yes Historical Provider, MD  aspirin EC 81 MG tablet Take 81 mg by mouth daily.   Yes Historical Provider, MD  atorvastatin (LIPITOR) 20 MG tablet Take 20 mg by mouth daily.   Yes Historical Provider, MD  furosemide (LASIX) 20 MG tablet Take 20 mg by mouth daily.   Yes Historical Provider, MD  metoprolol (LOPRESSOR) 100 MG tablet Take 50 mg by mouth 2 (two) times daily. 03/16/11  Yes Quintella Reichert, MD  nitroGLYCERIN (NITROSTAT) 0.4 MG SL tablet Place 0.4 mg under the tongue every 5 (five) minutes as needed. For chest pain   Yes Historical Provider, MD  potassium chloride SA (K-DUR,KLOR-CON) 20 MEQ tablet Take 20 mEq by mouth daily.   Yes Historical Provider, MD  traMADol (ULTRAM) 50 MG tablet Take 100 mg by mouth daily. Maximum dose= 8 tablets per day    Yes Historical Provider, MD  Vitamin D, Ergocalciferol, (DRISDOL) 50000 UNITS CAPS Take 50,000 Units by mouth every 14 (fourteen) days. On Friday    Historical Provider, MD   BP 136/71  Pulse 72  Temp(Src) 98.1  F (36.7 C) (Oral)  Resp 20  SpO2 96% Physical Exam  Nursing note and vitals reviewed. Constitutional: She is oriented to person, place, and time. She appears well-developed and well-nourished.  HENT:  Head: Normocephalic and atraumatic.  Eyes: EOM are normal. Pupils are equal, round, and reactive to light.  Neck: Neck supple.  Cardiovascular: Normal rate and regular rhythm.   Murmur heard. Pulmonary/Chest: Effort normal. No respiratory distress.  Abdominal: Soft. She exhibits no distension. There is no tenderness. There is no rebound and no guarding.  Musculoskeletal: She exhibits  edema.  Neurological: She is alert and oriented to person, place, and time. No cranial nerve deficit. Coordination normal.  Skin: Skin is warm and dry.    ED Course  Procedures (including critical care time) Labs Review Labs Reviewed  COMPREHENSIVE METABOLIC PANEL - Abnormal; Notable for the following:    Sodium 148 (*)    Glucose, Bld 106 (*)    Creatinine, Ser 1.13 (*)    GFR calc non Af Amer 47 (*)    GFR calc Af Amer 55 (*)    All other components within normal limits  CBC WITH DIFFERENTIAL  Rosezena SensorI-STAT TROPOININ, ED    Imaging Review Dg Chest 2 View  12/22/2013   CLINICAL DATA:  Chest pain and right arm pain. Nausea. History of smoking.  EXAM: CHEST  2 VIEW  COMPARISON:  Chest radiograph from 07/14/2012  FINDINGS: The lungs are hyperexpanded, with flattening of the hemidiaphragms, likely reflecting COPD. Minimal left basilar atelectasis or scarring is noted. Peribronchial thickening is noted, chronic in appearance. There is no evidence of pleural effusion or pneumothorax.  The heart is normal in size; the mediastinal contour is within normal limits. No acute osseous abnormalities are seen. Clips are noted within the right upper quadrant, reflecting prior cholecystectomy.  IMPRESSION: Findings of COPD; no acute cardiopulmonary process seen.   Electronically Signed   By: Roanna RaiderJeffery  Chang M.D.   On: 12/22/2013 21:38     EKG Interpretation   Date/Time:  Thursday December 22 2013 20:00:46 EDT Ventricular Rate:  66 PR Interval:  180 QRS Duration: 88 QT Interval:  418 QTC Calculation: 438 R Axis:   76 Text Interpretation:  Normal sinus rhythm Normal ECG No acute changes  Confirmed by Mirl Hillery, MD, Arelyn Gauer ([id number]) on 12/23/2013 1:02:18 AM      MDM   Final diagnoses:  Chest pain, atypical  Hypernatremia    Pt comes in with cc of chest pain, dib, diaphoresis. She had some vague vertigo y'day, that just lasted for a few seconds - no new meds. The neuro exam is non focal, and she has no  vertigo at this time.   Pt chest pain free right now. She had atypical chest pain - but it was left sided, pressure like and she had diophoresis. With her CAD hx, we will admit her for cardiac evaluation. Pt's ekg is WNL. Troponin is neg.    Derwood KaplanAnkit Tevis Conger, MD 12/23/13 [phone]

## 2013-12-23 NOTE — Discharge Summary (Signed)
Brittany Arnold, is a 73 y.o. female  DOB [birth year]  MRN [id number].  Admission date:  12/23/2013  Admitting Physician  Eduard Clos, MD  Discharge Date:  12/23/2013   Primary MD  No primary provider on file.  Recommendations for primary care physician for things to follow:   Monitor second risk factors for CAD, outpatient followup with pulmonary and cardiology.   Admission Diagnosis  Hypernatremia [276.0] Chest pain, atypical [786.59]   Discharge Diagnosis  Hypernatremia [276.0] Chest pain, atypical [786.59]    Principal Problem:   Chest pain Active Problems:   CAD (coronary artery disease)   HTN (hypertension)   COPD (chronic obstructive pulmonary disease)   MI (myocardial infarction)   Hypernatremia      Past Medical History  Diagnosis Date  . MI (myocardial infarction)   . Hypertension   . Coronary artery disease   . Arthritis     Past Surgical History  Procedure Laterality Date  . Coronary stent placement         History of present illness and  Hospital Course:     Kindly see H&P for history of present illness and admission details, please review complete Labs, Consult reports and Test reports for all details in brief  HPI  from the history and physical done on the day of admission   Brittany Arnold is a 73 y.o. female history of CAD status post stenting, hypertension, COPD, hyperlipidemia presented to the ER because of chest discomfort. Patient last night started developing some chest pressure which lasted for less than a minute with diaphoresis and nausea. Denies any associated abdominal pain but had some shortness of breath. Symptoms resolved quickly and patient is asymptomatic. Cardiac markers were negative. EKG is unremarkable. Chest x-ray does not show anything acute. Since patient has  history of CAD status post stenting patient has been readmitted to rule out for ACS. Patient states that night before last patient had some dizziness which lasted for around one hour and resolved without any intervention. Patient has been following up with cardiologist in [place] and patient's aspirin was decreased from 325 mg to 81 mg last week.     Hospital Course    1. Atypical chest pain. Due to her history of CAD, MI, placement of 3 DES in the past she was kept in 23 hour observation, she ruled out MI with 3 negative troponins, EKG was nonacute, she remained chest pain-free throughout her hospital stay. Was seen by cardiology and underwent an unremarkable stress test which showed no reversible ischemia and preserved EF. She did have a short run of SVT which resolved by itself during the stress test. She is already on beta blocker, aspirin and statin which will be continued along with as needed sublingual nitroglycerin. She will follow with her PCP and cardiologist in a prompt fashion post discharge.    2. Underlying history of COPD. At SpO2 as needed albuterol. Outpatient follow up with pulmonary recommended.    3. Essential hypertension and dyslipidemia. Resume medications  unchanged.     Discharge Condition: Stable   Follow UP  Follow-up Information   Follow up with Your PCP and Cardiologist. Schedule an appointment as soon as possible for a visit in 1 week.      Follow up with [facility] R, NP On 01/06/2014. (See for Dr. Katrinka Blazing at 11:15 am. [address], [address], [place], [place]; [phone]. )    Specialty:  Cardiology   Contact information:   [address] [place] [address] [phone]       Follow up with [facility], MD. Schedule an appointment as soon as possible for a visit in 1 week.   Specialty:  Pulmonary Disease   Contact information:   [address] [place] [address] [phone]         Discharge Instructions  and   Discharge Medications     Discharge Instructions   Diet - low sodium heart healthy    Complete by:  As directed      Discharge instructions    Complete by:  As directed   Follow with Primary MD  in 7 days   Get CBC, CMP  by Primary MD next visit.   Activity: As tolerated with Full fall precautions use walker/cane & assistance as needed   Disposition Home     Diet: Heart Healthy   For Heart failure patients - Check your Weight same time everyday, if you gain over 2 pounds, or you develop in leg swelling, experience more shortness of breath or chest pain, call your Primary MD immediately. Follow Cardiac Low Salt Diet and 1.8 lit/day fluid restriction.   On your next visit with her primary care physician please Get Medicines reviewed and adjusted.  Please request your Prim.MD to go over all Hospital Tests and Procedure/Radiological results at the follow up, please get all Hospital records sent to your Prim MD by signing hospital release before you go home.   If you experience worsening of your admission symptoms, develop shortness of breath, life threatening emergency, suicidal or homicidal thoughts you must seek medical attention immediately by calling 911 or calling your MD immediately  if symptoms less severe.  You Must read complete instructions/literature along with all the possible adverse reactions/side effects for all the Medicines you take and that have been prescribed to you. Take any new Medicines after you have completely understood and accpet all the possible adverse reactions/side effects.   Do not drive, operating heavy machinery, perform activities at heights, swimming or participation in water activities or provide baby sitting services if your were admitted for syncope or siezures until you have seen by Primary MD or a Neurologist and advised to do so again.  Do not drive when taking Pain medications.    Do not take more than prescribed Pain, Sleep and Anxiety  Medications  Special Instructions: If you have smoked or chewed Tobacco  in the last 2 yrs please stop smoking, stop any regular Alcohol  and or any Recreational drug use.  Wear Seat belts while driving.   Please note  You were cared for by a hospitalist during your hospital stay. If you have any questions about your discharge medications or the care you received while you were in the hospital after you are discharged, you can call the unit and asked to speak with the hospitalist on call if the hospitalist that took care of you is not available. Once you are discharged, your primary care physician will handle any further  medical issues. Please note that NO REFILLS for any discharge medications will be authorized once you are discharged, as it is imperative that you return to your primary care physician (or establish a relationship with a primary care physician if you do not have one) for your aftercare needs so that they can reassess your need for medications and monitor your lab values.     Increase activity slowly    Complete by:  As directed             Medication List         albuterol 108 (90 BASE) MCG/ACT inhaler  Commonly known as:  PROVENTIL HFA;VENTOLIN HFA  Inhale 1 puff into the lungs every 6 (six) hours as needed for wheezing or shortness of breath.     aspirin EC 81 MG tablet  Take 81 mg by mouth daily.     atorvastatin 20 MG tablet  Commonly known as:  LIPITOR  Take 20 mg by mouth daily.     furosemide 20 MG tablet  Commonly known as:  LASIX  Take 20 mg by mouth daily.     metoprolol 100 MG tablet  Commonly known as:  LOPRESSOR  Take 50 mg by mouth 2 (two) times daily.     nitroGLYCERIN 0.4 MG SL tablet  Commonly known as:  NITROSTAT  Place 0.4 mg under the tongue every 5 (five) minutes as needed. For chest pain     potassium chloride SA 20 MEQ tablet  Commonly known as:  K-DUR,KLOR-CON  Take 20 mEq by mouth daily.     tiotropium 18 MCG inhalation capsule    Commonly known as:  SPIRIVA HANDIHALER  Place 1 capsule (18 mcg total) into inhaler and inhale daily.     traMADol 50 MG tablet  Commonly known as:  ULTRAM  Take 100 mg by mouth daily. Maximum dose= 8 tablets per day     Vitamin D (Ergocalciferol) 50000 UNITS Caps capsule  Commonly known as:  DRISDOL  Take 50,000 Units by mouth every 14 (fourteen) days. On Friday          Diet and Activity recommendation: See Discharge Instructions above   Consults obtained - cardiology   Major procedures and Radiology Reports - PLEASE review detailed and final reports for all details, in brief -      Dg Chest 2 View  12/22/2013   CLINICAL DATA:  Chest pain and right arm pain. Nausea. History of smoking.  EXAM: CHEST  2 VIEW  COMPARISON:  Chest radiograph from 07/14/2012  FINDINGS: The lungs are hyperexpanded, with flattening of the hemidiaphragms, likely reflecting COPD. Minimal left basilar atelectasis or scarring is noted. Peribronchial thickening is noted, chronic in appearance. There is no evidence of pleural effusion or pneumothorax.  The heart is normal in size; the mediastinal contour is within normal limits. No acute osseous abnormalities are seen. Clips are noted within the right upper quadrant, reflecting prior cholecystectomy.  IMPRESSION: Findings of COPD; no acute cardiopulmonary process seen.   Electronically Signed   By: Roanna Raider M.D.   On: 12/22/2013 21:38   Nm Myocar Multi W/spect W/wall Motion / Ef  12/23/2013   CLINICAL DATA:  Chest pain, hypertension, coronary artery disease and syncope. History of prior myocardial infarction and coronary stenting.  EXAM: MYOCARDIAL IMAGING WITH SPECT (REST AND PHARMACOLOGIC-STRESS)  GATED LEFT VENTRICULAR WALL MOTION STUDY  LEFT VENTRICULAR EJECTION FRACTION  TECHNIQUE: Standard myocardial SPECT imaging was performed after resting intravenous injection of 10  mCi Tc-1475m sestamibi. Subsequently, intravenous infusion of Lexiscan was performed  under the supervision of the Cardiology staff. At peak effect of the drug, 30 mCi Tc-6675m sestamibi was injected intravenously and standard myocardial SPECT imaging was performed. Quantitative gated imaging was also performed to evaluate left ventricular wall motion, and estimate left ventricular ejection fraction.  COMPARISON:  None.  FINDINGS: Utilizing gated data, the end-diastolic volume is estimated to be 40 mL and the end systolic volume 6 mL. Calculated ejection fraction is 86%.  Gated wall motion analysis is within normal limits.  SPECT imaging shows no evidence of inducible myocardial ischemia or fixed perfusion defects.  IMPRESSION: Normal study demonstrating no evidence of inducible myocardial ischemia. Left ventricular function is normal with quantitative ejection fraction calculation of 86%.   Electronically Signed   By: Irish LackGlenn  Yamagata M.D.   On: 12/23/2013 17:46    Micro Results     No results found for this or any previous visit (from the past 240 hour(s)).     Today   Subjective:   Brittany FickBarbara Diclemente today has no headache,no chest abdominal pain,no new weakness tingling or numbness, feels much better wants to go home today.   Objective:   Blood pressure 126/67, pulse 75, temperature 98.5 F (36.9 C), temperature source Oral, resp. rate 20, height 5\' 3"  (1.6 m), weight 74.254 kg (163 lb 11.2 oz), SpO2 96.00%.   Intake/Output Summary (Last 24 hours) at 12/23/13 1808 Last data filed at 12/23/13 1300  Gross per 24 hour  Intake    360 ml  Output    250 ml  Net    110 ml    Exam Awake Alert, Oriented x 3, No new F.N deficits, Normal affect [place].AT,PERRAL Supple Neck,No JVD, No cervical lymphadenopathy appriciated.  Symmetrical Chest wall movement, Good air movement bilaterally, CTAB RRR,No Gallops,Rubs or new Murmurs, No Parasternal Heave +ve B.Sounds, Abd Soft, Non tender, No organomegaly appriciated, No rebound -guarding or rigidity. No Cyanosis, Clubbing or edema, No  new Rash or bruise  Data Review   CBC w Diff: Lab Results  Component Value Date   WBC 6.1 12/23/2013   HGB 13.8 12/23/2013   HCT 41.7 12/23/2013   PLT 167 12/23/2013   LYMPHOPCT 27 12/23/2013   MONOPCT 8 12/23/2013   EOSPCT 4 12/23/2013   BASOPCT 0 12/23/2013    CMP: Lab Results  Component Value Date   NA 147 12/23/2013   K 4.2 12/23/2013   CL 108 12/23/2013   CO2 29 12/23/2013   BUN 20 12/23/2013   CREATININE 1.10 12/23/2013   PROT 6.6 12/23/2013   ALBUMIN 3.6 12/23/2013   BILITOT 0.4 12/23/2013   ALKPHOS 93 12/23/2013   AST 18 12/23/2013   ALT 13 12/23/2013  .   Total Time in preparing paper work, data evaluation and todays exam - 35 minutes  Leroy SeaSINGH,Ayani Ospina K M.D on 12/23/2013 at 6:08 PM  [facility] Office  [phone]   **Disclaimer: This note may have been dictated with voice recognition software. Similar sounding words can inadvertently be transcribed and this note may contain transcription errors which may not have been corrected upon publication of note.**

## 2013-12-23 NOTE — Progress Notes (Signed)
Patient here for Chest, H/O CAd, MI, DES stents x 3, ruled out, pain free, Cards following, stress test today.

## 2013-12-23 NOTE — Consult Note (Signed)
Primary Physician: Primary Cardiologist:   HPI: Patient is a 73 yo with CAD, HTN, COPD, HL and noncompliance    Presented to ER with CP  Patient is s/p MI with DES in 2012 to LAD  Patient had a cath in March 2014  LAD:  50% prox to stent  Then 95% distal lesion LCx with segmental stenosis most severe 90%  RCA pateint   Patient underwent PCI /Promus DES in LAD overlapping previous stent and PCI/PromusDES to LCx   Yesterday pateint felt chest tightness in chest with SOB  She had cooked for family  Wonders if overdid it  Broke out into sweat.  Lasted 10 min.  Family said she looked bad.   Wed night had episode of dizziness after standing  No syncope  No tightness She does not think this is the heart.  Different from how it felt in 2012  Then  Was stabbin in back 2014 had tightness which was more severe.    She has been SOB since heart attack  Inhaler prescribed and it helped some    Up until yesterday was doing good.     Past Medical History  Diagnosis Date  . MI (myocardial infarction)   . Hypertension   . Coronary artery disease   . Arthritis     Medications Prior to Admission  Medication Sig Dispense Refill  . albuterol (PROVENTIL HFA;VENTOLIN HFA) 108 (90 BASE) MCG/ACT inhaler Inhale 1 puff into the lungs every 6 (six) hours as needed for wheezing or shortness of breath.      Marland Kitchen aspirin EC 81 MG tablet Take 81 mg by mouth daily.      Marland Kitchen atorvastatin (LIPITOR) 20 MG tablet Take 20 mg by mouth daily.      . furosemide (LASIX) 20 MG tablet Take 20 mg by mouth daily.      . metoprolol (LOPRESSOR) 100 MG tablet Take 50 mg by mouth 2 (two) times daily.      . nitroGLYCERIN (NITROSTAT) 0.4 MG SL tablet Place 0.4 mg under the tongue every 5 (five) minutes as needed. For chest pain      . potassium chloride SA (K-DUR,KLOR-CON) 20 MEQ tablet Take 20 mEq by mouth daily.      . traMADol (ULTRAM) 50 MG tablet Take 100 mg by mouth daily. Maximum dose= 8 tablets per day       . Vitamin  D, Ergocalciferol, (DRISDOL) 50000 UNITS CAPS Take 50,000 Units by mouth every 14 (fourteen) days. On Friday         . aspirin EC  325 mg Oral Daily  . atorvastatin  20 mg Oral Daily  . enoxaparin (LOVENOX) injection  40 mg Subcutaneous Q24H  . metoprolol  50 mg Oral BID  . sodium chloride  3 mL Intravenous Q12H  . sodium chloride  3 mL Intravenous Q12H  . traMADol  100 mg Oral Daily    Infusions:    Allergies  Allergen Reactions  . Penicillins Rash  . Sulfa Antibiotics Rash    History   Social History  . Marital Status: Widowed    Spouse Name: N/A    Number of Children: N/A  . Years of Education: N/A   Occupational History  . Not on file.   Social History Main Topics  . Smoking status: Former Games developer  . Smokeless tobacco: Not on file  . Alcohol Use: No  . Drug Use: No  . Sexual Activity:    Other Topics Concern  .  Not on file   Social History Narrative  . No narrative on file    History reviewed. No pertinent family history.  REVIEW OF SYSTEMS:  All systems reviewed  Negative to the above problem except as noted above.    PHYSICAL EXAM: Filed Vitals:   12/23/13 0557  BP: 141/68  Pulse: 70  Temp: 97.7 F (36.5 C)  Resp: 18     Intake/Output Summary (Last 24 hours) at 12/23/13 0745 Last data filed at 12/23/13 0559  Gross per 24 hour  Intake      0 ml  Output    250 ml  Net   -250 ml    General:  Well appearing. No respiratory difficulty HEENT: normal Neck: supple. no JVD. Carotids 2+ bilat; no bruits. No lymphadenopathy or thryomegaly appreciated. Cor: PMI nondisplaced. Regular rate & rhythm. No rubs, gallops or murmurs. Lungs: Decreased expiriatory  airflow  No rales or wheezes Abdomen: soft, nontender, nondistended. No hepatosplenomegaly. No bruits or masses. Good bowel sounds. Extremities: no cyanosis, clubbing, rash, edema Neuro: alert & oriented x 3, cranial nerves grossly intact. moves all 4 extremities w/o difficulty. Affect  pleasant.  ECG:  Results for orders placed during the hospital encounter of 12/23/13 (from the past 24 hour(s))  CBC WITH DIFFERENTIAL     Status: None   Collection Time    12/22/13  8:29 PM      Result Value Ref Range   WBC 8.1  4.0 - 10.5 K/uL   RBC 4.54  3.87 - 5.11 MIL/uL   Hemoglobin 14.4  12.0 - 15.0 g/dL   HCT 40.942.5  81.136.0 - 91.446.0 %   MCV 93.6  78.0 - 100.0 fL   MCH 31.7  26.0 - 34.0 pg   MCHC 33.9  30.0 - 36.0 g/dL   RDW 78.213.3  95.611.5 - 21.315.5 %   Platelets 190  150 - 400 K/uL   Neutrophils Relative % 63  43 - 77 %   Neutro Abs 5.1  1.7 - 7.7 K/uL   Lymphocytes Relative 25  12 - 46 %   Lymphs Abs 2.0  0.7 - 4.0 K/uL   Monocytes Relative 8  3 - 12 %   Monocytes Absolute 0.6  0.1 - 1.0 K/uL   Eosinophils Relative 4  0 - 5 %   Eosinophils Absolute 0.3  0.0 - 0.7 K/uL   Basophils Relative 0  0 - 1 %   Basophils Absolute 0.0  0.0 - 0.1 K/uL  COMPREHENSIVE METABOLIC PANEL     Status: Abnormal   Collection Time    12/22/13  8:29 PM      Result Value Ref Range   Sodium 148 (*) 137 - 147 mEq/L   Potassium 4.7  3.7 - 5.3 mEq/L   Chloride 104  96 - 112 mEq/L   CO2 30  19 - 32 mEq/L   Glucose, Bld 106 (*) 70 - 99 mg/dL   BUN 22  6 - 23 mg/dL   Creatinine, Ser 0.861.13 (*) 0.50 - 1.10 mg/dL   Calcium 9.3  8.4 - 57.810.5 mg/dL   Total Protein 7.2  6.0 - 8.3 g/dL   Albumin 3.9  3.5 - 5.2 g/dL   AST 19  0 - 37 U/L   ALT 14  0 - 35 U/L   Alkaline Phosphatase 109  39 - 117 U/L   Total Bilirubin 0.6  0.3 - 1.2 mg/dL   GFR calc non Af Amer 47 (*) >90 mL/min  GFR calc Af Amer 55 (*) >90 mL/min   Anion gap 14  5 - 15  I-STAT TROPOININ, ED     Status: None   Collection Time    12/22/13  8:34 PM      Result Value Ref Range   Troponin i, poc 0.00  0.00 - 0.08 ng/mL   Comment 3           COMPREHENSIVE METABOLIC PANEL     Status: Abnormal   Collection Time    12/23/13  4:26 AM      Result Value Ref Range   Sodium 147  137 - 147 mEq/L   Potassium 4.2  3.7 - 5.3 mEq/L   Chloride 108  96 -  112 mEq/L   CO2 29  19 - 32 mEq/L   Glucose, Bld 107 (*) 70 - 99 mg/dL   BUN 20  6 - 23 mg/dL   Creatinine, Ser 9.75  0.50 - 1.10 mg/dL   Calcium 9.1  8.4 - 88.3 mg/dL   Total Protein 6.6  6.0 - 8.3 g/dL   Albumin 3.6  3.5 - 5.2 g/dL   AST 18  0 - 37 U/L   ALT 13  0 - 35 U/L   Alkaline Phosphatase 93  39 - 117 U/L   Total Bilirubin 0.4  0.3 - 1.2 mg/dL   GFR calc non Af Amer 49 (*) >90 mL/min   GFR calc Af Amer 57 (*) >90 mL/min   Anion gap 10  5 - 15  D-DIMER, QUANTITATIVE     Status: None   Collection Time    12/23/13  4:26 AM      Result Value Ref Range   D-Dimer, Quant <0.27  0.00 - 0.48 ug/mL-FEU  CBC WITH DIFFERENTIAL     Status: None   Collection Time    12/23/13  4:26 AM      Result Value Ref Range   WBC 6.1  4.0 - 10.5 K/uL   RBC 4.38  3.87 - 5.11 MIL/uL   Hemoglobin 13.8  12.0 - 15.0 g/dL   HCT 25.4  98.2 - 64.1 %   MCV 95.2  78.0 - 100.0 fL   MCH 31.5  26.0 - 34.0 pg   MCHC 33.1  30.0 - 36.0 g/dL   RDW 58.3  09.4 - 07.6 %   Platelets 167  150 - 400 K/uL   Neutrophils Relative % 61  43 - 77 %   Neutro Abs 3.7  1.7 - 7.7 K/uL   Lymphocytes Relative 27  12 - 46 %   Lymphs Abs 1.7  0.7 - 4.0 K/uL   Monocytes Relative 8  3 - 12 %   Monocytes Absolute 0.5  0.1 - 1.0 K/uL   Eosinophils Relative 4  0 - 5 %   Eosinophils Absolute 0.2  0.0 - 0.7 K/uL   Basophils Relative 0  0 - 1 %   Basophils Absolute 0.0  0.0 - 0.1 K/uL  TROPONIN I     Status: None   Collection Time    12/23/13  4:26 AM      Result Value Ref Range   Troponin I <0.30  <0.30 ng/mL   Dg Chest 2 View  12/22/2013   CLINICAL DATA:  Chest pain and right arm pain. Nausea. History of smoking.  EXAM: CHEST  2 VIEW  COMPARISON:  Chest radiograph from 07/14/2012  FINDINGS: The lungs are hyperexpanded, with flattening of the hemidiaphragms, likely reflecting  COPD. Minimal left basilar atelectasis or scarring is noted. Peribronchial thickening is noted, chronic in appearance. There is no evidence of pleural  effusion or pneumothorax.  The heart is normal in size; the mediastinal contour is within normal limits. No acute osseous abnormalities are seen. Clips are noted within the right upper quadrant, reflecting prior cholecystectomy.  IMPRESSION: Findings of COPD; no acute cardiopulmonary process seen.   Electronically Signed   By: Roanna Raider M.D.   On: 12/22/2013 21:38     ASSESSMENT: Patient is a 73 yo with known CAD  Had episode of chest tightness yesterday  Not quite the same as before.   On exam does have signs of signif COPD    Has r/o for MI I would recomm a lexiscan myoview to r/o ischemia Patient may benefit for Spiriva    Will follow  Continue statin.

## 2013-12-23 NOTE — Progress Notes (Signed)
[facility] is to read pt's stress test. I called radiology to inquire about about results. Secretary told me that the MD who was supposed to read this test had gone for the day. She stated that she would find out if someone would be able to read it this evening and I would be called back regarding the status. Will cont to monitor

## 2013-12-23 NOTE — H&P (Signed)
[facility] History and Physical  Brittany BaasBarbara H Heidinger ZOX:[id number] DOB: 01/11/1941 DOA: 12/23/2013  Referring physician: ER physician. PCP: No primary provider on file.   Chief Complaint: Chest discomfort.  HPI: Brittany Arnold is a 73 y.o. female history of CAD status post stenting, hypertension, COPD, hyperlipidemia presented to the ER because of chest discomfort. Patient last night started developing some chest pressure which lasted for less than a minute with diaphoresis and nausea. Denies any associated abdominal pain but had some shortness of breath. Symptoms resolved quickly and patient is asymptomatic. Cardiac markers were negative. EKG is unremarkable. Chest x-ray does not show anything acute. Since patient has history of CAD status post stenting patient has been readmitted to rule out for ACS. Patient states that night before last patient had some dizziness which lasted for around one hour and resolved without any intervention. Patient has been following up with cardiologist in [place] and patient's aspirin was decreased from 325 mg to 81 mg last week.   Review of Systems: As presented in the history of presenting illness, rest negative.  Past Medical History  Diagnosis Date  . MI (myocardial infarction)   . Hypertension   . Coronary artery disease   . Arthritis    Past Surgical History  Procedure Laterality Date  . Coronary stent placement     Social History:  reports that she has quit smoking. She does not have any smokeless tobacco history on file. She reports that she does not drink alcohol or use illicit drugs. Where does patient live  home.  Can patient participate in ADLs?  yes.   Allergies  Allergen Reactions  . Penicillins Rash  . Sulfa Antibiotics Rash    Family History: History reviewed. No pertinent family history.    Prior to Admission medications   Medication Sig Start Date End Date Taking? Authorizing Provider  albuterol (PROVENTIL HFA;VENTOLIN  HFA) 108 (90 BASE) MCG/ACT inhaler Inhale 1 puff into the lungs every 6 (six) hours as needed for wheezing or shortness of breath.   Yes Historical Provider, MD  aspirin EC 81 MG tablet Take 81 mg by mouth daily.   Yes Historical Provider, MD  atorvastatin (LIPITOR) 20 MG tablet Take 20 mg by mouth daily.   Yes Historical Provider, MD  furosemide (LASIX) 20 MG tablet Take 20 mg by mouth daily.   Yes Historical Provider, MD  metoprolol (LOPRESSOR) 100 MG tablet Take 50 mg by mouth 2 (two) times daily. 03/16/11  Yes Quintella Reichertraci R Turner, MD  nitroGLYCERIN (NITROSTAT) 0.4 MG SL tablet Place 0.4 mg under the tongue every 5 (five) minutes as needed. For chest pain   Yes Historical Provider, MD  potassium chloride SA (K-DUR,KLOR-CON) 20 MEQ tablet Take 20 mEq by mouth daily.   Yes Historical Provider, MD  traMADol (ULTRAM) 50 MG tablet Take 100 mg by mouth daily. Maximum dose= 8 tablets per day    Yes Historical Provider, MD  Vitamin D, Ergocalciferol, (DRISDOL) 50000 UNITS CAPS Take 50,000 Units by mouth every 14 (fourteen) days. On Friday    Historical Provider, MD    Physical Exam: Filed Vitals:   12/23/13 0145 12/23/13 0200 12/23/13 0201 12/23/13 0224  BP: 138/73 136/71 136/71 102/87  Pulse: 73 72 72 72  Temp:    98.4 F (36.9 C)  TempSrc:    Oral  Resp: 18 16 20 18   Height:    5\' 3"  (1.6 m)  Weight:    74.254 kg (163 lb 11.2 oz)  SpO2:  96% 97% 96% 98%     General:   well-developed and nourished.   Eyes: Anicteric no pallor.   ENT: No discharge from ears eyes nose mouth.   Neck: No mass felt.   Cardiovascular:  S1-S2 heard.   Respiratory:  no rhonchi or crepitations.   Abdomen:  soft nontender bowel sounds present. No guarding rigidity.   Skin: No rash.   Musculoskeletal:  no edema.   Psychiatric:  appears normal.   Neurologic:  alert and oriented to time place and person. Moves all extremities.   Labs on Admission:  Basic Metabolic Panel:  Recent Labs Lab 12/22/13 2029   NA 148*  K 4.7  CL 104  CO2 30  GLUCOSE 106*  BUN 22  CREATININE 1.13*  CALCIUM 9.3   Liver Function Tests:  Recent Labs Lab 12/22/13 2029  AST 19  ALT 14  ALKPHOS 109  BILITOT 0.6  PROT 7.2  ALBUMIN 3.9   No results found for this basename: LIPASE, AMYLASE,  in the last 168 hours No results found for this basename: AMMONIA,  in the last 168 hours CBC:  Recent Labs Lab 12/22/13 2029  WBC 8.1  NEUTROABS 5.1  HGB 14.4  HCT 42.5  MCV 93.6  PLT 190   Cardiac Enzymes: No results found for this basename: CKTOTAL, CKMB, CKMBINDEX, TROPONINI,  in the last 168 hours  BNP (last 3 results) No results found for this basename: PROBNP,  in the last 8760 hours CBG: No results found for this basename: GLUCAP,  in the last 168 hours  Radiological Exams on Admission: Dg Chest 2 View  12/22/2013   CLINICAL DATA:  Chest pain and right arm pain. Nausea. History of smoking.  EXAM: CHEST  2 VIEW  COMPARISON:  Chest radiograph from 07/14/2012  FINDINGS: The lungs are hyperexpanded, with flattening of the hemidiaphragms, likely reflecting COPD. Minimal left basilar atelectasis or scarring is noted. Peribronchial thickening is noted, chronic in appearance. There is no evidence of pleural effusion or pneumothorax.  The heart is normal in size; the mediastinal contour is within normal limits. No acute osseous abnormalities are seen. Clips are noted within the right upper quadrant, reflecting prior cholecystectomy.  IMPRESSION: Findings of COPD; no acute cardiopulmonary process seen.   Electronically Signed   By: Roanna Raider M.D.   On: 12/22/2013 21:38    EKG: Independently reviewed.  normal sinus rhythm.   Assessment/Plan Principal Problem:   Chest pain Active Problems:   HTN (hypertension)   COPD (chronic obstructive pulmonary disease)   Hypernatremia   1. Chest pain  - presently chest pain-free. Given the history of CAD status post stenting patient at this time will be admitted  to rule out ACS. Aspirin. When necessary nitroglycerin. Cycle cardiac markers. Keep patient n.p.o. in anticipation of possible procedure in consult cardiology in a.m.  2. Hypernatremia - repeat metabolic panel and if still high then may need increased by mouth intake of fluids and possible D5W infusion. Hold Lasix for now. 3. COPD  - presently not wheezing. Continue when necessary inhalers.  4. Hypertension - continue home medications. 5. Hyperlipidemia - continue home medications.    Code Status: full code.   Family Communication:  none.   Disposition Plan:  admit for observation.     Nakaiya Beddow N. [facility] Pager [phone].  If 7PM-7AM, please contact night-coverage [email or web address] Password [facility] 12/23/2013, 3:44 AM

## 2013-12-23 NOTE — Progress Notes (Signed)
Lexiscan CL performed 

## 2014-01-06 ENCOUNTER — Encounter: Payer: Medicare Other | Admitting: Cardiology

## 2014-04-20 ENCOUNTER — Encounter (HOSPITAL_COMMUNITY): Payer: Self-pay | Admitting: Interventional Cardiology

## 2014-06-14 ENCOUNTER — Encounter (INDEPENDENT_AMBULATORY_CARE_PROVIDER_SITE_OTHER): Payer: Medicare Other | Admitting: Ophthalmology

## 2014-06-14 DIAGNOSIS — I10 Essential (primary) hypertension: Secondary | ICD-10-CM | POA: Diagnosis not present

## 2014-06-14 DIAGNOSIS — H3531 Nonexudative age-related macular degeneration: Secondary | ICD-10-CM

## 2014-06-14 DIAGNOSIS — H35032 Hypertensive retinopathy, left eye: Secondary | ICD-10-CM

## 2014-12-20 ENCOUNTER — Ambulatory Visit (INDEPENDENT_AMBULATORY_CARE_PROVIDER_SITE_OTHER): Payer: Medicare Other | Admitting: Ophthalmology

## 2017-12-26 ENCOUNTER — Other Ambulatory Visit: Payer: Self-pay | Admitting: Family Medicine

## 2018-01-04 ENCOUNTER — Other Ambulatory Visit: Payer: Self-pay | Admitting: Family Medicine

## 2021-12-09 ENCOUNTER — Other Ambulatory Visit (HOSPITAL_COMMUNITY): Payer: Self-pay | Admitting: Family Medicine

## 2021-12-09 ENCOUNTER — Ambulatory Visit (HOSPITAL_COMMUNITY)
Admission: RE | Admit: 2021-12-09 | Discharge: 2021-12-09 | Disposition: A | Discharge status: Home / Self Care | Payer: Medicare Other | Source: Ambulatory Visit | Attending: Family Medicine | Admitting: Family Medicine

## 2021-12-09 DIAGNOSIS — R202 Paresthesia of skin: Secondary | ICD-10-CM | POA: Diagnosis not present

## 2022-12-31 ENCOUNTER — Ambulatory Visit: Admission: RE | Admit: 2022-12-31 | Payer: Medicare Other | Source: Ambulatory Visit

## 2022-12-31 ENCOUNTER — Other Ambulatory Visit: Payer: Self-pay | Admitting: Family Medicine

## 2022-12-31 DIAGNOSIS — R0602 Shortness of breath: Secondary | ICD-10-CM
